# Patient Record
Sex: Male | Born: 1960 | Hispanic: Yes | Marital: Married | Smoking: Current some day smoker
Health system: Southern US, Community
[De-identification: ages and names within clinical notes are randomized; demographics above are authoritative.]

## PROBLEM LIST (undated history)

## (undated) DIAGNOSIS — C801 Malignant (primary) neoplasm, unspecified: Secondary | ICD-10-CM

## (undated) DIAGNOSIS — M1990 Unspecified osteoarthritis, unspecified site: Secondary | ICD-10-CM

## (undated) DIAGNOSIS — R0981 Nasal congestion: Secondary | ICD-10-CM

## (undated) DIAGNOSIS — R51 Headache: Secondary | ICD-10-CM

## (undated) DIAGNOSIS — R519 Headache, unspecified: Secondary | ICD-10-CM

## (undated) DIAGNOSIS — J189 Pneumonia, unspecified organism: Secondary | ICD-10-CM

## (undated) DIAGNOSIS — E119 Type 2 diabetes mellitus without complications: Secondary | ICD-10-CM

## (undated) DIAGNOSIS — G4730 Sleep apnea, unspecified: Secondary | ICD-10-CM

## (undated) DIAGNOSIS — I10 Essential (primary) hypertension: Secondary | ICD-10-CM

## (undated) HISTORY — PX: TONSILLECTOMY: SUR1361

---

## 2013-01-24 DIAGNOSIS — C801 Malignant (primary) neoplasm, unspecified: Secondary | ICD-10-CM

## 2013-01-24 HISTORY — PX: PROSTATE SURGERY: SHX751

## 2013-01-24 HISTORY — DX: Malignant (primary) neoplasm, unspecified: C80.1

## 2014-08-20 DIAGNOSIS — C61 Malignant neoplasm of prostate: Secondary | ICD-10-CM | POA: Insufficient documentation

## 2015-11-24 DIAGNOSIS — J189 Pneumonia, unspecified organism: Secondary | ICD-10-CM

## 2015-11-24 HISTORY — DX: Pneumonia, unspecified organism: J18.9

## 2016-01-26 ENCOUNTER — Ambulatory Visit (INDEPENDENT_AMBULATORY_CARE_PROVIDER_SITE_OTHER): Payer: Worker's Compensation | Admitting: Family Medicine

## 2016-01-26 VITALS — BP 136/88 | HR 88 | Temp 98.4°F | Resp 18 | Ht 68.0 in | Wt 333.8 lb

## 2016-01-26 DIAGNOSIS — M70031 Crepitant synovitis (acute) (chronic), right wrist: Secondary | ICD-10-CM

## 2016-01-26 DIAGNOSIS — M779 Enthesopathy, unspecified: Secondary | ICD-10-CM | POA: Diagnosis not present

## 2016-01-26 NOTE — Patient Instructions (Signed)
De Quervain Disease De Quervain disease is inflammation of the tendon on the thumb side of the wrist. Tendons are cords of tissue that connect bones to muscles. The tendons in your hand pass through a tunnel, or sheath. A slippery layer of tissue (synovium) lets the tendons move smoothly in the sheath. With de Quervain disease, the sheath swells or thickens, causing friction and pain. The condition is also called de Quervain tendinosis and de Quervain syndrome. It occurs most often in women who are 30-50 years old. CAUSES  The exact cause of de Quervain disease is not known. It may result from:   Overusing your hands, especially with repetitive motions that involve twisting your hand or using a forceful grip.  Pregnancy.  Rheumatoid disease. RISK FACTORS You may have a greater risk for de Quervain disease if you:  Are a middle-aged woman.  Are pregnant.  Have rheumatoid arthritis.  Have diabetes.  Use your hands far more than normal, especially with a tight grip or excessive twisting. SIGNS AND SYMPTOMS Pain on the thumb side of your wrist is the main symptom of de Quervain disease. Other signs and symptoms include:  Pain that gets worse when you grasp something or turn your wrist.  Pain that extends up the forearm.  Cysts in the area of the pain.  Swelling of your wrist and hand.  A sensation of snapping in the wrist.  Trouble moving the thumb and wrist. DIAGNOSIS  Your health care provider may diagnose de Quervain disease based on your signs and symptoms. A physical exam will also be done. A simple test (Finkelstein test) that involves pulling your thumb and wrist to see if this causes pain can help determine whether you have the condition. Sometimes you may need to have an X-ray.  TREATMENT  Avoiding any activity that causes pain and swelling is the best treatment. Other options include:  Wearing a splint.  Taking medicine. Anti-inflammatory medicines and corticosteroid  injections may reduce inflammation and relieve pain.  Having surgery if other treatments do not work. HOME CARE INSTRUCTIONS   Using ice can be helpful after doing activities that involve the sore wrist. To apply ice to the injured area:  Put ice in a plastic bag.  Place a towel between your skin and the bag.  Leave the ice on for 20 minutes, 2-3 times a day.  Take medicines only as directed by your health care provider.  Wear your splint as directed. This will allow your hand to rest and heal. SEEK MEDICAL CARE IF:   Your pain medicine does not help.   Your pain gets worse.  You develop new symptoms. MAKE SURE YOU:   Understand these instructions.  Will watch your condition.  Will get help right away if you are not doing well or get worse.   This information is not intended to replace advice given to you by your health care provider. Make sure you discuss any questions you have with your health care provider.   Document Released: 09/04/2001 Document Revised: 12/31/2014 Document Reviewed: 04/14/2014 Elsevier Interactive Patient Education 2016 Elsevier Inc.  

## 2016-01-26 NOTE — Progress Notes (Signed)
   Subjective:    Patient ID: Jerry Griffith, male    DOB: 21-Jul-1961, 55 y.o.   MRN: [id number] By signing my name below, I, Zola Button, attest that this documentation has been prepared under the direction and in the presence of [name], MD.  Electronically Signed: Zola Button, Medical Scribe. 01/26/2016. 11:35 AM.  HPI HPI Comments: Taivion Alvelo is a 55 y.o. male who presents to the [facility] for a worker's comp injury. 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 He reports having sharp pain to his right wrist that started the following day. He also reports having numbness to his thumb and index finger. Patient did see a doctor 3 days ago and was given a wrist brace and anti-inflammatory medications.    Review of Systems  Musculoskeletal: Positive for arthralgias.  Neurological: Positive for numbness.       Objective:   Physical Exam CONSTITUTIONAL: Well developed/well nourished HEAD: Normocephalic/atraumatic EYES: EOM/PERRL ENMT: Mucous membranes moist NECK: supple no meningeal signs SPINE: entire spine nontender CV: S1/S2 noted, no murmurs/rubs/gallops noted LUNGS: Lungs are clear to auscultation bilaterally, no apparent distress ABDOMEN: soft, nontender, no rebound or guarding GU: no cva tenderness NEURO: Pt is awake/alert, moves all extremitiesx4 EXTREMITIES: pulses normal, full ROM. Tender over the distal lateral radius at the joint line of the wrist. SKIN: warm, color normal PSYCH: no abnormalities of mood noted  Right extensor pollicis longus tendon area over the distal radius was injected with 1 cc Marcaine and 0.5 cc Kenalog.      Assessment & Plan:  Patient will come back Monday for a re-check.  This chart was scribed in my presence and reviewed by me personally.    ICD-9-CM ICD-10-CM   1. Tendonitis 726.90 M77.9   2. Crepitant synovitis of wrist, right  727.00 M70.031      Signed, [name], MD

## 2016-01-30 ENCOUNTER — Ambulatory Visit (INDEPENDENT_AMBULATORY_CARE_PROVIDER_SITE_OTHER): Payer: Worker's Compensation | Admitting: Family Medicine

## 2016-01-30 VITALS — BP 152/80 | HR 96 | Temp 98.8°F | Resp 16 | Ht 68.0 in | Wt 330.0 lb

## 2016-01-30 DIAGNOSIS — Y990 Civilian activity done for income or pay: Secondary | ICD-10-CM | POA: Diagnosis not present

## 2016-01-30 DIAGNOSIS — M25531 Pain in right wrist: Secondary | ICD-10-CM | POA: Diagnosis not present

## 2016-01-30 DIAGNOSIS — M65831 Other synovitis and tenosynovitis, right forearm: Secondary | ICD-10-CM

## 2016-01-30 DIAGNOSIS — M65841 Other synovitis and tenosynovitis, right hand: Secondary | ICD-10-CM

## 2016-01-30 MED ORDER — DICLOFENAC SODIUM 75 MG PO TBEC: 75.0000 mg | DELAYED_RELEASE_TABLET | Freq: Two times a day (BID) | ORAL | Status: DC

## 2016-01-30 NOTE — Patient Instructions (Addendum)
Wear the wrist splint as much of the time as possible  Limit your work this week per the work note.  Take diclofenac 75 mg one twice daily for inflammation in the wrist  Apply ice for 15 min 4 times daily  Return Friday or Saturday for one more recheck. If not improving we probably should see on to orthopedics

## 2016-01-30 NOTE — Progress Notes (Signed)
Patient ID: Jerry Griffith, male    DOB: 1961-12-19  Age: 55 y.o. MRN: [id number]  Chief Complaint  Patient presents with  . Follow-up    workers comp, rt. wrist, x 1 week     Subjective:   Patient had an acute flare of pain in his right wrist when moving a printer away from the wall. That was about 01/22/2016. Monday weeks ago he went to his primary care who gave him naproxen. He came in here on Thursday and saw Dr. Carlean Jews who gave an injection of Kenalog for tenosynovitis.  He has persisted with a numbness in his index and thumb finger since the initial pains and started. He periodically has had a cutting like pain down to his thumb. He has been hurting and tender in his wrists and sometimes it goes up a little further up the arm. He never had this before.  He has been wearing a short wrist splint which he has.  Current allergies, medications, problem list, past/family and social histories reviewed.  Objective:  BP 152/80 mmHg  Pulse 96  Temp(Src) 98.8 F (37.1 C) (Oral)  Resp 16  Ht 5\' 8"  (1.727 m)  Wt 330 lb (149.687 kg)  BMI 50.19 kg/m2  SpO2 97%  Subjective abnormal sensation in his thumb and index finger. The area of maximum pain is at the base of the thumb along the wrist. Not exquisitely painful and tender with movement.  Assessment & Plan:   Assessment: No diagnosis found.    Plan: Probably a partially resolved tenosynovitis, which is not extremely tender at this time. I think it needs more rest. The Kenalog still should be working on this. We'll give her a little additional insight treatment. He has been taking naproxen 500 mg daily. We'll change to diclofenac 75 mg twice daily. Spica wrist splint  No orders of the defined types were placed in this encounter.    No orders of the defined types were placed in this encounter.         There are no Patient Instructions on file for this visit.   No Follow-up on file.   HOPPER,DAVID, MD 01/30/2016

## 2016-02-03 ENCOUNTER — Ambulatory Visit (INDEPENDENT_AMBULATORY_CARE_PROVIDER_SITE_OTHER): Payer: Worker's Compensation | Admitting: Family Medicine

## 2016-02-03 VITALS — BP 150/92 | HR 101 | Temp 98.7°F | Resp 16 | Ht 68.0 in | Wt 331.0 lb

## 2016-02-03 DIAGNOSIS — M65841 Other synovitis and tenosynovitis, right hand: Secondary | ICD-10-CM

## 2016-02-03 DIAGNOSIS — M65831 Other synovitis and tenosynovitis, right forearm: Secondary | ICD-10-CM

## 2016-02-03 NOTE — Progress Notes (Addendum)
@UMFCLOGO @  By signing my name below, I, Jerry Griffith, attest that this documentation has been prepared under the direction and in the presence of [name], MD.  Electronically Signed: [name], ED Scribe. 02/03/2016. 1:43 PM.  Patient ID: Jerry Griffith MRN: [id number], DOB: April 08, 1961, 55 y.o. Date of Encounter: 02/03/2016, 1:43 PM  Primary Physician: No primary care provider on file.  Chief Complaint:  Chief Complaint  Patient presents with  . Follow-up    right wrist injury     HPI: 55 y.o. year old male with history below presents with right wrist pain. Pt reports occasional shocking sensation in right wrist with positional changes in wrist and right thumb. This morning while combing his hand and brushing his teeth he felt this sensation. States pain has worsened since last visit. He reports occasional numbness in right 1st and 2nd finger. He has been wearing a wrist splint without relief to pain.   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  History reviewed. No pertinent past medical history.   Home Meds: Prior to Admission medications   Medication Sig Start Date End Date Taking? Authorizing Provider  diclofenac (VOLTAREN) 75 MG EC tablet Take 1 tablet (75 mg total) by mouth 2 (two) times daily. 01/30/16  Yes Posey Boyer, MD  lisinopril-hydrochlorothiazide (PRINZIDE,ZESTORETIC) 20-25 MG tablet Take 1 tablet by mouth daily.   Yes Historical Provider, MD  metFORMIN (GLUCOPHAGE) 500 MG tablet Take 500 mg by mouth 2 (two) times daily with a meal.   Yes Historical Provider, MD    Allergies: No Known Allergies  Social History   Social History  . Marital Status: Unknown    Spouse Name: N/A  . Number of Children: N/A  . Years of Education: N/A   Occupational History  . Not on file.   Social History Main Topics  . Smoking status: Never Smoker   . Smokeless tobacco: Never Used  . Alcohol Use: No  . Drug Use: No  . Sexual Activity: Not on file    Other Topics Concern  . Not on file   Social History Narrative     Review of Systems: Constitutional: negative for chills, fever, night sweats, weight changes, or fatigue  HEENT: negative for vision changes, hearing loss, congestion, rhinorrhea, ST, epistaxis, or sinus pressure Cardiovascular: negative for chest pain or palpitations Respiratory: negative for hemoptysis, wheezing, shortness of breath, or cough Abdominal: negative for abdominal pain, nausea, vomiting, diarrhea, or constipation Dermatological: negative for rash Neurologic: negative for headache, dizziness, or syncope All other systems reviewed and are otherwise negative with the exception to those above and in the HPI.   Physical Exam: Blood pressure 150/92, pulse 101, temperature 98.7 F (37.1 C), temperature source Oral, resp. rate 16, height 5\' 8"  (1.727 m), weight 331 lb (150.141 kg), SpO2 96 %., Body mass index is 50.34 kg/(m^2). General: Well developed, well nourished, in no acute distress. Head: Normocephalic, atraumatic,  Lungs: Clear bilaterally to auscultation without wheezes, rales, or rhonchi. Breathing is unlabored. Heart: RRR with S1 S2. No murmurs, rubs, or gallops appreciated. Msk:  Strength and tone normal for age. Non tender in right wrist. Good ROM. Extremities/Skin: Warm and dry. No clubbing or cyanosis. No edema. No rashes or suspicious lesions. Neuro: Alert and oriented X 3. Moves all extremities spontaneously. Gait is normal. CNII-XII grossly in tact. Psych:  Responds to questions appropriately with a normal affect.    ASSESSMENT AND PLAN:  55 y.o. year old male with  This  chart was scribed in my presence and reviewed by me personally.    ICD-9-CM ICD-10-CM   1. Extensor tenosynovitis of right wrist 727.05 M65.841 Ambulatory referral to Hand Surgery     Signed, [name], MD 02/03/2016 1:43 PM

## 2016-02-03 NOTE — Patient Instructions (Signed)
We are going to make an appointment with a hand specialist for you next week. Until a time she should stay on for

## 2016-02-03 NOTE — Progress Notes (Deleted)
[facility] [address] [place] [address] [phone]  Date:  02/03/2016   Name:  Jerry Griffith   DOB:  07-17-1961   MRN:  [id number]  PCP:  No primary care provider on file.    Chief Complaint: Follow-up   History of Present Illness:  This is a 55 y.o. male with PMH *** who is presenting  Review of Systems:  Review of Systems  There are no active problems to display for this patient.   Prior to Admission medications   Medication Sig Start Date End Date Taking? Authorizing Provider  diclofenac (VOLTAREN) 75 MG EC tablet Take 1 tablet (75 mg total) by mouth 2 (two) times daily. 01/30/16  Yes Posey Boyer, MD  lisinopril-hydrochlorothiazide (PRINZIDE,ZESTORETIC) 20-25 MG tablet Take 1 tablet by mouth daily.   Yes Historical Provider, MD  metFORMIN (GLUCOPHAGE) 500 MG tablet Take 500 mg by mouth 2 (two) times daily with a meal.   Yes Historical Provider, MD    No Known Allergies  History reviewed. No pertinent past surgical history.  Social History  Substance Use Topics  . Smoking status: Never Smoker   . Smokeless tobacco: Never Used  . Alcohol Use: No    History reviewed. No pertinent family history.  Medication list has been reviewed and updated.  Physical Examination:  Physical Exam  BP 168/102 mmHg  Pulse 101  Temp(Src) 98.7 F (37.1 C) (Oral)  Resp 16  Ht 5\' 8"  (1.727 m)  Wt 331 lb (150.141 kg)  BMI 50.34 kg/m2  SpO2 96%  Assessment and Plan:

## 2016-03-13 ENCOUNTER — Other Ambulatory Visit: Payer: Self-pay | Admitting: Orthopedic Surgery

## 2016-03-13 DIAGNOSIS — M5412 Radiculopathy, cervical region: Secondary | ICD-10-CM

## 2016-03-21 ENCOUNTER — Ambulatory Visit
Admission: RE | Admit: 2016-03-21 | Discharge: 2016-03-21 | Disposition: A | Discharge status: Home / Self Care | Payer: Managed Care, Other (non HMO) | Source: Ambulatory Visit | Attending: Orthopedic Surgery | Admitting: Orthopedic Surgery

## 2016-03-21 DIAGNOSIS — M5412 Radiculopathy, cervical region: Secondary | ICD-10-CM

## 2016-03-23 ENCOUNTER — Other Ambulatory Visit: Payer: Self-pay | Admitting: Orthopedic Surgery

## 2016-03-28 ENCOUNTER — Encounter (HOSPITAL_COMMUNITY): Payer: Self-pay

## 2016-03-28 ENCOUNTER — Other Ambulatory Visit (HOSPITAL_COMMUNITY): Payer: Self-pay | Admitting: *Deleted

## 2016-03-28 ENCOUNTER — Encounter (HOSPITAL_COMMUNITY)
Admission: RE | Admit: 2016-03-28 | Discharge: 2016-03-28 | Disposition: A | Discharge status: Home / Self Care | Payer: Worker's Compensation | Source: Ambulatory Visit | Attending: Orthopedic Surgery | Admitting: Orthopedic Surgery

## 2016-03-28 DIAGNOSIS — R000 Tachycardia, unspecified: Secondary | ICD-10-CM | POA: Insufficient documentation

## 2016-03-28 DIAGNOSIS — I10 Essential (primary) hypertension: Secondary | ICD-10-CM | POA: Insufficient documentation

## 2016-03-28 DIAGNOSIS — Z01818 Encounter for other preprocedural examination: Secondary | ICD-10-CM | POA: Insufficient documentation

## 2016-03-28 DIAGNOSIS — Z01812 Encounter for preprocedural laboratory examination: Secondary | ICD-10-CM | POA: Insufficient documentation

## 2016-03-28 DIAGNOSIS — G5601 Carpal tunnel syndrome, right upper limb: Secondary | ICD-10-CM | POA: Insufficient documentation

## 2016-03-28 HISTORY — DX: Essential (primary) hypertension: I10

## 2016-03-28 HISTORY — DX: Sleep apnea, unspecified: G47.30

## 2016-03-28 HISTORY — DX: Malignant (primary) neoplasm, unspecified: C80.1

## 2016-03-28 HISTORY — DX: Headache, unspecified: R51.9

## 2016-03-28 HISTORY — DX: Unspecified osteoarthritis, unspecified site: M19.90

## 2016-03-28 HISTORY — DX: Nasal congestion: R09.81

## 2016-03-28 HISTORY — DX: Type 2 diabetes mellitus without complications: E11.9

## 2016-03-28 HISTORY — DX: Headache: R51

## 2016-03-28 HISTORY — DX: Pneumonia, unspecified organism: J18.9

## 2016-03-28 LAB — BASIC METABOLIC PANEL
ANION GAP: 10 (ref 5–15)
BUN: 15 mg/dL (ref 6–20)
CALCIUM: 9.3 mg/dL (ref 8.9–10.3)
CO2: 27 mmol/L (ref 22–32)
Chloride: 106 mmol/L (ref 101–111)
Creatinine, Ser: 0.72 mg/dL (ref 0.61–1.24)
GFR calc non Af Amer: >60 mL/min (ref >60)
Glucose, Bld: 164 mg/dL — ABNORMAL HIGH (ref 65–99)
POTASSIUM: 4.1 mmol/L (ref 3.5–5.1)
SODIUM: 143 mmol/L (ref 135–145)

## 2016-03-28 LAB — CBC
HEMATOCRIT: 43.3 % (ref 39.0–52.0)
HEMOGLOBIN: 13.6 g/dL (ref 13.0–17.0)
MCH: 26.9 pg (ref 26.0–34.0)
MCHC: 31.4 g/dL (ref 30.0–36.0)
MCV: 85.7 fL (ref 78.0–100.0)
Platelets: 284 10*3/uL (ref 150–400)
RBC: 5.05 MIL/uL (ref 4.22–5.81)
RDW: 16.1 % — ABNORMAL HIGH (ref 11.5–15.5)
WBC: 11.1 10*3/uL — ABNORMAL HIGH (ref 4.0–10.5)

## 2016-03-28 LAB — GLUCOSE, CAPILLARY: GLUCOSE-CAPILLARY: 154 mg/dL — AB (ref 65–99)

## 2016-03-28 NOTE — Pre-Procedure Instructions (Addendum)
Jerry Griffith  03/28/2016      [facility] # [place], [place] [address] Phone: [phone] Fax: [phone]    Your procedure is scheduled on Thursday, April 05, 2016    Report to [facility] Entrance "A" Admitting Office at 6:30 AM.   Call this number if you have problems the morning of surgery: [phone]   Any questions prior to day of surgery, please call [phone] between 8 & 4 PM.   Remember:  Do not eat food or drink liquids after midnight Wednesday, 04/04/16.  STOP all herbel meds nsaids (aleve,naproxen,advil,ibuprofen) 5 days prior to surgery starting 03/31/16 including diclofenac, aspirin, vitamins(vit c, fish oil, multi)   No metformin am of surgery                      How to Manage Your Diabetes Before Surgery   Why is it important to control my blood sugar before and after surgery?   Improving blood sugar levels before and after surgery helps healing and can limit problems.  A way of improving blood sugar control is eating a healthy diet by:  - Eating less sugar and carbohydrates  - Increasing activity/exercise  - Talk with your doctor about reaching your blood sugar goals  High blood sugars (greater than 180 mg/dL) can raise your risk of infections and slow down your recovery so you will need to focus on controlling your diabetes during the weeks before surgery.  Make sure that the doctor who takes care of your diabetes knows about your planned surgery including the date and location.  How do I manage my blood sugars before surgery?   Check your blood sugar at least 4 times a day, 2 days before surgery to make sure that they are not too high or low.  Check your blood sugar the morning of your surgery when you wake up and every 2 hours until you get to the Short-Stay unit.  Treat a low blood sugar (less than 70 mg/dL) with 1/2 cup of clear juice (cranberry or apple), 4  glucose tablets, OR glucose gel.  Recheck blood sugar in 15 minutes after treatment (to make sure it is greater than 70 mg/dL).  If blood sugar is not greater than 70 mg/dL on re-check, call [phone] for further instructions.   Report your blood sugar to the Short-Stay nurse when you get to Short-Stay.  References:  [facility], 2007 "How to Manage your Diabetes Before and After Surgery".  What do I do about my diabetes medications?   Do not take oral diabetes medicines (pills) the morning of surgery.(metformin)   Do not wear jewelry.  Do not wear lotions, powders, or cologne.  You may wear deodorant.  Men may shave face and neck.  Do not bring valuables to the hospital.  [facility] is not responsible for any belongings or valuables.  Contacts, dentures or bridgework may not be worn into surgery.  Leave your suitcase in the car.  After surgery it may be brought to your room.  For patients admitted to the hospital, discharge time will be determined by your treatment team.  Patients discharged the day of surgery will not be allowed to drive home.   Special instructions:  [facility] - Preparing for Surgery  Before surgery, you can play an important role.  Because skin is not sterile, your skin needs  to be as free of germs as possible.  You can reduce the number of germs on you skin by washing with CHG (chlorahexidine gluconate) soap before surgery.  CHG is an antiseptic cleaner which kills germs and bonds with the skin to continue killing germs even after washing.  Please DO NOT use if you have an allergy to CHG or antibacterial soaps.  If your skin becomes reddened/irritated stop using the CHG and inform your nurse when you arrive at Short Stay.  Do not shave (including legs and underarms) for at least 48 hours prior to the first CHG shower.  You may shave your face.  Please follow these instructions carefully:   1.  Shower with CHG Soap the night  before surgery and the                                morning of Surgery.  2.  If you choose to wash your hair, wash your hair first as usual with your       normal shampoo.  3.  After you shampoo, rinse your hair and body thoroughly to remove the                      Shampoo.  4.  Use CHG as you would any other liquid soap.  You can apply chg directly       to the skin and wash gently with scrungie or a clean washcloth.  5.  Apply the CHG Soap to your body ONLY FROM THE NECK DOWN.        Do not use on open wounds or open sores.  Avoid contact with your eyes, ears, mouth and genitals (private parts).  Wash genitals (private parts) with your normal soap.  6.  Wash thoroughly, paying special attention to the area where your surgery        will be performed.  7.  Thoroughly rinse your body with warm water from the neck down.  8.  DO NOT shower/wash with your normal soap after using and rinsing off       the CHG Soap.  9.  Pat yourself dry with a clean towel.            10.  Wear clean pajamas.            11.  Place clean sheets on your bed the night of your first shower and do not        sleep with pets.  Day of Surgery  Do not apply any lotions the morning of surgery.  Please wear clean clothes to the hospital.   Please read over the following fact sheets that you were given. Pain Booklet, Coughing and Deep Breathing and Surgical Site Infection Prevention

## 2016-04-04 MED ORDER — DEXTROSE 5 % IV SOLN
3.0000 g | INTRAVENOUS | Status: AC
  Administered 2016-04-05: 3 g via INTRAVENOUS
  Filled 2016-04-04: qty 3000

## 2016-04-05 ENCOUNTER — Ambulatory Visit (HOSPITAL_COMMUNITY)
Admission: RE | Admit: 2016-04-05 | Discharge: 2016-04-05 | Disposition: A | Discharge status: Home / Self Care | Payer: Worker's Compensation | Source: Ambulatory Visit | Attending: Orthopedic Surgery | Admitting: Orthopedic Surgery

## 2016-04-05 ENCOUNTER — Ambulatory Visit (HOSPITAL_COMMUNITY): Payer: Worker's Compensation | Admitting: Anesthesiology

## 2016-04-05 ENCOUNTER — Encounter (HOSPITAL_COMMUNITY)
Admission: RE | Disposition: A | Discharge status: Home / Self Care | Payer: Self-pay | Source: Ambulatory Visit | Attending: Orthopedic Surgery

## 2016-04-05 ENCOUNTER — Encounter (HOSPITAL_COMMUNITY): Payer: Self-pay | Admitting: *Deleted

## 2016-04-05 DIAGNOSIS — E119 Type 2 diabetes mellitus without complications: Secondary | ICD-10-CM | POA: Diagnosis not present

## 2016-04-05 DIAGNOSIS — Z7984 Long term (current) use of oral hypoglycemic drugs: Secondary | ICD-10-CM | POA: Diagnosis not present

## 2016-04-05 DIAGNOSIS — F17290 Nicotine dependence, other tobacco product, uncomplicated: Secondary | ICD-10-CM | POA: Diagnosis not present

## 2016-04-05 DIAGNOSIS — G5601 Carpal tunnel syndrome, right upper limb: Secondary | ICD-10-CM | POA: Diagnosis present

## 2016-04-05 DIAGNOSIS — Z8546 Personal history of malignant neoplasm of prostate: Secondary | ICD-10-CM | POA: Insufficient documentation

## 2016-04-05 DIAGNOSIS — Z79899 Other long term (current) drug therapy: Secondary | ICD-10-CM | POA: Diagnosis not present

## 2016-04-05 DIAGNOSIS — G4730 Sleep apnea, unspecified: Secondary | ICD-10-CM | POA: Insufficient documentation

## 2016-04-05 DIAGNOSIS — I10 Essential (primary) hypertension: Secondary | ICD-10-CM | POA: Diagnosis not present

## 2016-04-05 DIAGNOSIS — Z7951 Long term (current) use of inhaled steroids: Secondary | ICD-10-CM | POA: Diagnosis not present

## 2016-04-05 HISTORY — PX: CARPAL TUNNEL RELEASE: SHX101

## 2016-04-05 LAB — GLUCOSE, CAPILLARY
GLUCOSE-CAPILLARY: 114 mg/dL — AB (ref 65–99)
GLUCOSE-CAPILLARY: 135 mg/dL — AB (ref 65–99)
Glucose-Capillary: 138 mg/dL — ABNORMAL HIGH (ref 65–99)

## 2016-04-05 SURGERY — CARPAL TUNNEL RELEASE
Anesthesia: Monitor Anesthesia Care | Site: Wrist | Laterality: Right

## 2016-04-05 MED ORDER — CHLORHEXIDINE GLUCONATE 4 % EX LIQD: 60.0000 mL | Freq: Once | CUTANEOUS | Status: DC

## 2016-04-05 MED ORDER — 0.9 % SODIUM CHLORIDE (POUR BTL) OPTIME
TOPICAL | Status: DC | PRN
  Administered 2016-04-05: 1000 mL

## 2016-04-05 MED ORDER — BUPIVACAINE HCL (PF) 0.25 % IJ SOLN
INTRAMUSCULAR | Status: DC | PRN
  Administered 2016-04-05: 10 mL

## 2016-04-05 MED ORDER — FENTANYL CITRATE (PF) 250 MCG/5ML IJ SOLN
INTRAMUSCULAR | Status: AC
  Filled 2016-04-05: qty 5

## 2016-04-05 MED ORDER — LACTATED RINGERS IV SOLN
INTRAVENOUS | Status: DC
  Administered 2016-04-05: 07:00:00 via INTRAVENOUS

## 2016-04-05 MED ORDER — MIDAZOLAM HCL 2 MG/2ML IJ SOLN
INTRAMUSCULAR | Status: AC
  Filled 2016-04-05: qty 2

## 2016-04-05 MED ORDER — FENTANYL CITRATE (PF) 100 MCG/2ML IJ SOLN: 25.0000 ug | INTRAMUSCULAR | Status: DC | PRN

## 2016-04-05 MED ORDER — OXYCODONE HCL 5 MG PO TABS: 5.0000 mg | ORAL_TABLET | Freq: Once | ORAL | Status: DC | PRN

## 2016-04-05 MED ORDER — ONDANSETRON HCL 4 MG/2ML IJ SOLN: 4.0000 mg | Freq: Once | INTRAMUSCULAR | Status: DC | PRN

## 2016-04-05 MED ORDER — MIDAZOLAM HCL 5 MG/5ML IJ SOLN
INTRAMUSCULAR | Status: DC | PRN
  Administered 2016-04-05: 1 mg via INTRAVENOUS

## 2016-04-05 MED ORDER — PROPOFOL 1000 MG/100ML IV EMUL
INTRAVENOUS | Status: AC
  Filled 2016-04-05: qty 100

## 2016-04-05 MED ORDER — OXYCODONE HCL 5 MG/5ML PO SOLN: 5.0000 mg | Freq: Once | ORAL | Status: DC | PRN

## 2016-04-05 MED ORDER — PROPOFOL 10 MG/ML IV BOLUS
INTRAVENOUS | Status: AC
  Filled 2016-04-05: qty 40

## 2016-04-05 MED ORDER — FENTANYL CITRATE (PF) 100 MCG/2ML IJ SOLN
INTRAMUSCULAR | Status: DC | PRN
  Administered 2016-04-05: 50 ug via INTRAVENOUS

## 2016-04-05 MED ORDER — PROPOFOL 500 MG/50ML IV EMUL
INTRAVENOUS | Status: DC | PRN
  Administered 2016-04-05: 50 ug/kg/min via INTRAVENOUS

## 2016-04-05 MED ORDER — BUPIVACAINE HCL (PF) 0.25 % IJ SOLN
INTRAMUSCULAR | Status: AC
  Filled 2016-04-05: qty 30

## 2016-04-05 MED ORDER — HYDROCODONE-ACETAMINOPHEN 5-325 MG PO TABS: ORAL_TABLET | ORAL | Status: DC

## 2016-04-05 SURGICAL SUPPLY — 39 items
BANDAGE ACE 3X5.8 VEL STRL LF (GAUZE/BANDAGES/DRESSINGS) ×3 IMPLANT
BANDAGE ELASTIC 3 VELCRO ST LF (GAUZE/BANDAGES/DRESSINGS) IMPLANT
BANDAGE ELASTIC 4 VELCRO ST LF (GAUZE/BANDAGES/DRESSINGS) IMPLANT
BNDG ESMARK 4X9 LF (GAUZE/BANDAGES/DRESSINGS) ×3 IMPLANT
BNDG GAUZE ELAST 4 BULKY (GAUZE/BANDAGES/DRESSINGS) ×3 IMPLANT
CORDS BIPOLAR (ELECTRODE) ×3 IMPLANT
COVER SURGICAL LIGHT HANDLE (MISCELLANEOUS) ×3 IMPLANT
CUFF TOURNIQUET SINGLE 18IN (TOURNIQUET CUFF) IMPLANT
CUFF TOURNIQUET SINGLE 24IN (TOURNIQUET CUFF) IMPLANT
DRAPE SURG 17X23 STRL (DRAPES) ×3 IMPLANT
DRSG PAD ABDOMINAL 8X10 ST (GAUZE/BANDAGES/DRESSINGS) ×3 IMPLANT
DURAPREP 26ML APPLICATOR (WOUND CARE) ×3 IMPLANT
GAUZE SPONGE 4X4 12PLY STRL (GAUZE/BANDAGES/DRESSINGS) ×3 IMPLANT
GAUZE XEROFORM 1X8 LF (GAUZE/BANDAGES/DRESSINGS) ×3 IMPLANT
GLOVE BIO SURGEON STRL SZ7.5 (GLOVE) ×6 IMPLANT
GLOVE BIOGEL PI IND STRL 8 (GLOVE) ×1 IMPLANT
GLOVE BIOGEL PI INDICATOR 8 (GLOVE) ×2
GOWN STRL REUS W/ TWL LRG LVL3 (GOWN DISPOSABLE) ×2 IMPLANT
GOWN STRL REUS W/TWL LRG LVL3 (GOWN DISPOSABLE) ×4
KIT BASIN OR (CUSTOM PROCEDURE TRAY) ×3 IMPLANT
KIT ROOM TURNOVER OR (KITS) ×3 IMPLANT
NEEDLE HYPO 25GX1X1/2 BEV (NEEDLE) ×3 IMPLANT
NS IRRIG 1000ML POUR BTL (IV SOLUTION) ×3 IMPLANT
PACK ORTHO EXTREMITY (CUSTOM PROCEDURE TRAY) ×3 IMPLANT
PAD ARMBOARD 7.5X6 YLW CONV (MISCELLANEOUS) ×6 IMPLANT
PADDING CAST ABS 4INX4YD NS (CAST SUPPLIES)
PADDING CAST ABS COTTON 4X4 ST (CAST SUPPLIES) IMPLANT
SPONGE LAP 4X18 X RAY DECT (DISPOSABLE) IMPLANT
SUCTION FRAZIER HANDLE 10FR (MISCELLANEOUS) ×2
SUCTION TUBE FRAZIER 10FR DISP (MISCELLANEOUS) ×1 IMPLANT
SUT ETHILON 3 0 PS 1 (SUTURE) ×3 IMPLANT
SUT VIC AB 3-0 SH 18 (SUTURE) IMPLANT
SYR CONTROL 10ML LL (SYRINGE) ×3 IMPLANT
TOWEL OR 17X24 6PK STRL BLUE (TOWEL DISPOSABLE) ×3 IMPLANT
TOWEL OR 17X26 10 PK STRL BLUE (TOWEL DISPOSABLE) ×3 IMPLANT
TUBE CONNECTING 12'X1/4 (SUCTIONS) ×1
TUBE CONNECTING 12X1/4 (SUCTIONS) ×2 IMPLANT
UNDERPAD 30X30 INCONTINENT (UNDERPADS AND DIAPERS) ×3 IMPLANT
WATER STERILE IRR 1000ML POUR (IV SOLUTION) IMPLANT

## 2016-04-05 NOTE — Anesthesia Postprocedure Evaluation (Signed)
Anesthesia Post Note  Patient: Jerry Griffith  Procedure(s) Performed: Procedure(s) (LRB): RIGHT CARPAL TUNNEL RELEASE (Right)  Patient location during evaluation: PACU Anesthesia Type: MAC and Bier Block Level of consciousness: awake, awake and alert and oriented Pain management: pain level controlled Vital Signs Assessment: post-procedure vital signs reviewed and stable Respiratory status: spontaneous breathing, nonlabored ventilation and respiratory function stable Cardiovascular status: blood pressure returned to baseline Anesthetic complications: no    Last Vitals:  Filed Vitals:   04/05/16 0943 04/05/16 0958  BP: 139/85 134/87  Pulse: 85 86  Temp:  36.6 C  Resp: 14 13    Last Pain: There were no vitals filed for this visit.               Maddison Kilner COKER

## 2016-04-05 NOTE — Discharge Instructions (Signed)

## 2016-04-05 NOTE — Anesthesia Preprocedure Evaluation (Signed)
Anesthesia Evaluation  Patient identified by MRN, date of birth, ID band Patient awake    Reviewed: Allergy & Precautions, NPO status , Patient's Chart, lab work & pertinent test results  Airway Mallampati: III  TM Distance: >3 FB Neck ROM: Full    Dental  (+) Teeth Intact, Dental Advisory Given   Pulmonary Current Smoker,    breath sounds clear to auscultation       Cardiovascular hypertension,  Rhythm:Regular Rate:Normal     Neuro/Psych    GI/Hepatic   Endo/Other  diabetes  Renal/GU      Musculoskeletal   Abdominal (+) + obese,   Peds  Hematology   Anesthesia Other Findings   Reproductive/Obstetrics                             Anesthesia Physical Anesthesia Plan  ASA: III  Anesthesia Plan: MAC and Regional   Post-op Pain Management:    Induction:   Airway Management Planned: Natural Airway and Nasal Cannula  Additional Equipment:   Intra-op Plan:   Post-operative Plan: Extubation in OR  Informed Consent: I have reviewed the patients History and Physical, chart, labs and discussed the procedure including the risks, benefits and alternatives for the proposed anesthesia with the patient or authorized representative who has indicated his/her understanding and acceptance.   Dental advisory given  Plan Discussed with: CRNA and Anesthesiologist  Anesthesia Plan Comments: (R. CTS Type 2 DM glucose 138  Hypertension Obesity Sleep apnea on CPAP  Plan IV Regional )        Anesthesia Quick Evaluation

## 2016-04-05 NOTE — H&P (Signed)
  Jerry Griffith is an 55 y.o. male.   Chief Complaint: right carpal tunnel syndrome HPI: 55 yo male with carpal tunnel syndrome right hand.  This has been bothersome to him since an injury at work.  Positive nerve conduction studies.  He wishes to have a right carpal tunnel release for management of the symptoms.  Allergies: No Known Allergies  Past Medical History  Diagnosis Date  . Hypertension   . Sleep apnea     cpap  . Sinus congestion   . Pneumonia 12/16    ?  Marland Kitchen Diabetes mellitus without complication ([place])   . 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 Arthritis   . Cancer [facility]) 2/14    prostate    Past Surgical History  Procedure Laterality Date  . Prostate surgery  2/14  . Tonsillectomy      Family History: History reviewed. No pertinent family history.  Social History:   reports that he has been smoking Cigars.  He has never used smokeless tobacco. He reports that he does not drink alcohol or use illicit drugs.  Medications: Medications Prior to Admission  Medication Sig Dispense Refill  . fluticasone (FLONASE) 50 MCG/ACT nasal spray Place 1 spray into both nostrils daily.    Marland Kitchen lisinopril-hydrochlorothiazide (PRINZIDE,ZESTORETIC) 20-25 MG tablet Take 1 tablet by mouth daily.    . metFORMIN (GLUCOPHAGE) 500 MG tablet Take 500 mg by mouth daily with breakfast.     . Multiple Vitamin (MULTIVITAMIN) capsule Take 1 capsule by mouth daily.    . Omega-3 1000 MG CAPS Take 1 g by mouth daily.    . vitamin C (ASCORBIC ACID) 250 MG tablet Take 250 mg by mouth daily.      Results for orders placed or performed during the hospital encounter of 04/05/16 (from the past 48 hour(s))  Glucose, capillary     Status: Abnormal   Collection Time: 04/05/16  6:09 AM  Result Value Ref Range   Glucose-Capillary 138 (H) 65 - 99 mg/dL  Glucose, capillary     Status: Abnormal   Collection Time: 04/05/16  8:12 AM  Result Value Ref Range   Glucose-Capillary 114 (H) 65 - 99 mg/dL   Comment 1 Notify RN     No results found.   A comprehensive review of systems was negative except for: Neurological: positive for headaches  Blood pressure 168/88, pulse 89, temperature 98.3 F (36.8 C), temperature source Oral, resp. rate 20, height 5' 8.5" (1.74 m), weight 150.594 kg (332 lb), SpO2 97 %.  General appearance: alert, cooperative and appears stated age Head: Normocephalic, without obvious abnormality, atraumatic Neck: supple, symmetrical, trachea midline Resp: clear to auscultation bilaterally Cardio: regular rate and rhythm GI: non-tender Extremities: Intact sensation and capillary refill all digits.  +epl/fpl/io.  No wounds. Pulses: 2+ and symmetric Skin: Skin color, texture, turgor normal. No rashes or lesions Neurologic: Grossly normal Incision/Wound:none  Assessment/Plan Right carpal tunnel syndrome.  Non operative and operative treatment options were discussed with the patient and patient wishes to proceed with operative treatment. Risks, benefits, and alternatives of surgery were discussed and the patient agrees with the plan of care.   Mollye Guinta R 04/05/2016, 8:28 AM

## 2016-04-05 NOTE — Progress Notes (Signed)
Jerry Griffith to be D/C'd home per MD order. Discussed with the patient and all questions fully answered.  VSS, Surgical site clean, dry, intact with compression wrap in place.  IV catheter discontinued intact. Site without signs and symptoms of complications. Dressing and pressure applied.  An After Visit Summary was printed and given to the patient. Patient received prescription.  D/c education completed with patient/family including follow up instructions, medication list, d/c activities limitations if indicated, with other d/c instructions as indicated by MD - patient able to verbalize understanding, all questions fully answered.   Patient instructed to return to ED, call 911, or call MD for any changes in condition.   Patient to be escorted via [place], and D/C home via private auto.

## 2016-04-05 NOTE — Op Note (Signed)
[id number] 

## 2016-04-05 NOTE — Brief Op Note (Signed)
04/05/2016  9:20 AM  PATIENT:  Purnell Hijazi  55 y.o. male  PRE-OPERATIVE DIAGNOSIS:  RIGHT CARPAL TUNNEL SYNDROME  POST-OPERATIVE DIAGNOSIS:  RIGHT CARPAL TUNNEL SYNDROME  PROCEDURE:  Procedure(s): RIGHT CARPAL TUNNEL RELEASE (Right)  SURGEON:  Surgeon(s) and Role:    * Leanora Cover, MD - Primary  PHYSICIAN ASSISTANT:   ASSISTANTS: none   ANESTHESIA:   Bier block with sedation  EBL:     BLOOD ADMINISTERED:none  DRAINS: none   LOCAL MEDICATIONS USED:  MARCAINE     SPECIMEN:  No Specimen  DISPOSITION OF SPECIMEN:  N/A  COUNTS:  YES  TOURNIQUET:   Total Tourniquet Time Documented: Upper Arm (Right) - 33 minutes Total: Upper Arm (Right) - 33 minutes   DICTATION: .Other Dictation: Dictation Number [id number]  PLAN OF CARE: Discharge to home after PACU  PATIENT DISPOSITION:  PACU - hemodynamically stable.

## 2016-04-05 NOTE — Transfer of Care (Signed)
Immediate Anesthesia Transfer of Care Note  Patient: Jerry Griffith  Procedure(s) Performed: Procedure(s): RIGHT CARPAL TUNNEL RELEASE (Right)  Patient Location: PACU  Anesthesia Type:Regional  Level of Consciousness: awake, alert , oriented and patient cooperative  Airway & Oxygen Therapy: Patient Spontanous Breathing and room air  Post-op Assessment: Report given to RN, Post -op Vital signs reviewed and stable and Patient moving all extremities  Post vital signs: Reviewed and stable  Last Vitals:  Filed Vitals:   04/05/16 0629 04/05/16 0927  BP:  140/77  Pulse: 89   Temp:    Resp:      Complications: No apparent anesthesia complications

## 2016-04-06 NOTE — Op Note (Signed)
NAMEKERVEN, STEINHARDT NO.:  [id number]  MEDICAL RECORD NO.:  [id number]  LOCATION:  [facility]                         FACILITY:  [facility]  PHYSICIAN:  Leanora Cover, MD        DATE OF BIRTH:  06-24-1961  DATE OF PROCEDURE:  04/05/2016 DATE OF DISCHARGE:  04/05/2016                              OPERATIVE REPORT   PREOPERATIVE DIAGNOSIS:  Right carpal tunnel syndrome.  POSTOPERATIVE DIAGNOSIS:  Right carpal tunnel syndrome.  PROCEDURE:  Right carpal tunnel release.  SURGEON:  Leanora Cover, MD.  ASSISTANT:  None.  ANESTHESIA:  Bier block with sedation.  IV FLUIDS:  Per anesthesia flow sheet.  ESTIMATED BLOOD:  Minimal.  COMPLICATIONS:  None.  SPECIMENS:  None.  TIME OF TOURNIQUET:  33 minutes.  DISPOSITION:  Stable to PACU.  INDICATIONS:  Mr. Dircks is a 55 year old male, who states he started having pins and needles and pain in the right hand after moving a carpet at work.  He has positive nerve conduction studies.  He wished to have a carpal tunnel release for management of symptoms.  Risks, benefits, and alternatives of surgery were discussed including the risk of blood loss; infection; damage to nerves, vessels, tendons, ligaments, bone; failure of surgery; need for additional surgery; complications with wound healing; continued pain; recurrence of carpal tunnel syndrome; and damage to motor branch.  He voiced understanding of these risks and elected to proceed.  OPERATIVE COURSE:  After being identified preoperatively by myself, the patient and I agreed upon procedure and site of procedure.  The surgical site was marked.  The risks, benefits, and alternatives of surgery were reviewed and he wished to proceed.  Surgical consent had been signed. He was given IV Ancef as preoperative antibiotic prophylaxis.  He was transferred to the operating room and placed on the operating room table in supine position with the right upper extremity on arm board.   Bier block anesthesia was induced by Anesthesiology.  Right upper extremity was prepped and draped in normal sterile orthopedic fashion.  Surgical pause was performed between surgeons, anesthesia, and operating room staff; and all were in agreement as to the patient, procedure, and site of procedure.  Tourniquet at the proximal aspect of the extremity had been inflated for the Bier block.  Incision made over the transverse carpal ligament, carried into subcutaneous tissues by spreading technique.  Bipolar electrocautery was used to obtain hemostasis.  The palmar fascia was sharply incised.  The transverse carpal ligament was identified and incised sharply with knife.  It was incised distally first.  Care was taken to ensure complete decompression distally.  It was then incised proximally.  Scissors were used to split the distal aspect of the volar antebrachial fascia.  The finger was placed into the wound to ensure complete decompression which was the case.  The nerve was inspected.  It was flattened and hyperemic.  The motor branch was identified and was intact.  There was thickened synovium surrounding the tendons.  The wound was copiously irrigated with sterile saline.  It was closed with 3-0 nylon in horizontal mattress fashion.  It was injected with 10 mL of 0.25% plain Marcaine to aid in postoperative analgesia.  It was then dressed with sterile Xeroform, 4x4s, and ABD and wrapped with Kerlix and Ace bandage.  Tourniquet was deflated at 33 minutes. Fingertips were pink with brisk capillary refill after deflation of tourniquet.  The operative drapes were broken down.  The patient was awoken from anesthesia safely.  He was transferred back to stretcher and taken to PACU in stable condition.  I will see him back in the office in 1 week for postoperative followup.  I will give him Norco 5/325, 1-2 p.o. q.6 hours p.r.n. pain, dispensed #30.     Leanora Cover, MD     KK/MEDQ  D:   04/05/2016  T:  04/06/2016  Job:  [id number]

## 2016-04-10 ENCOUNTER — Encounter (HOSPITAL_COMMUNITY): Payer: Self-pay | Admitting: Orthopedic Surgery

## 2016-12-10 DIAGNOSIS — E559 Vitamin D deficiency, unspecified: Secondary | ICD-10-CM | POA: Insufficient documentation

## 2018-02-21 IMAGING — MR MR CERVICAL SPINE W/O CM
4 of 5 series · 28 of 48 positions shown · non-contrast
Comparison: None.

CLINICAL DATA: Neck pain with numbness in the right thumb and index
finger.

EXAM:
MRI CERVICAL SPINE WITHOUT CONTRAST
TECHNIQUE: Multiplanar, multisequence MR imaging of the cervical spine was
performed. No intravenous contrast was administered.

[Series 3: T2 · sagittal · 3.0mm · 0.66mm/px · 6 of 13 slices shown (1 of 2)]
[im 1/13]
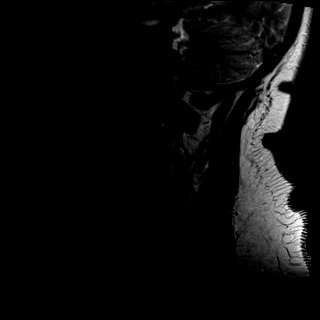
[im 3/13]
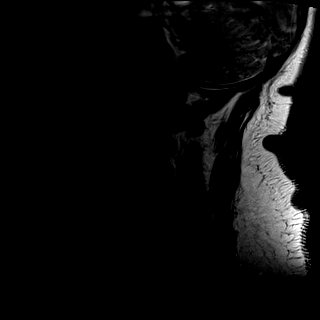
[im 5/13]
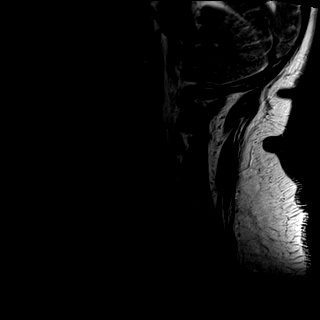
[im 8/13]
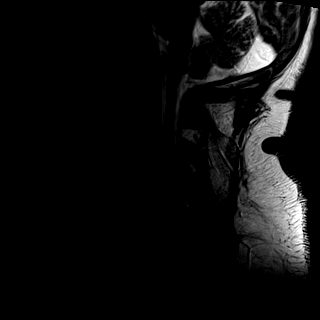
[im 10/13]
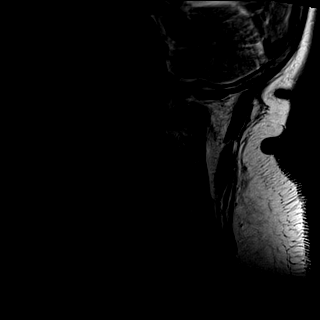
[im 13/13]
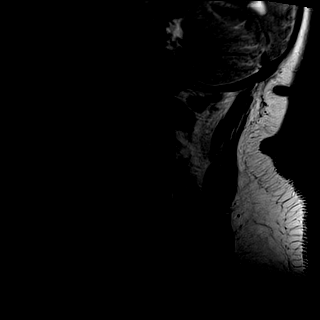

[Series 4: T1 · sagittal · 3.0mm · 0.41mm/px · 7 of 13 slices shown]
[im 1/13]
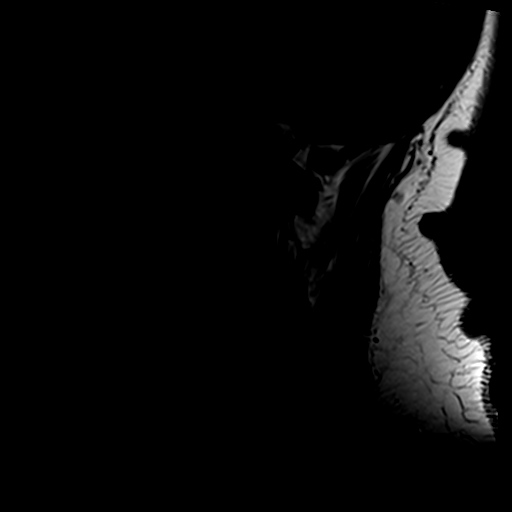
[im 3/13]
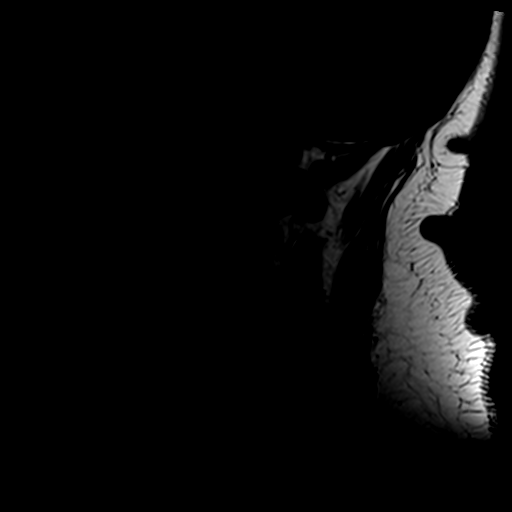
[im 5/13]
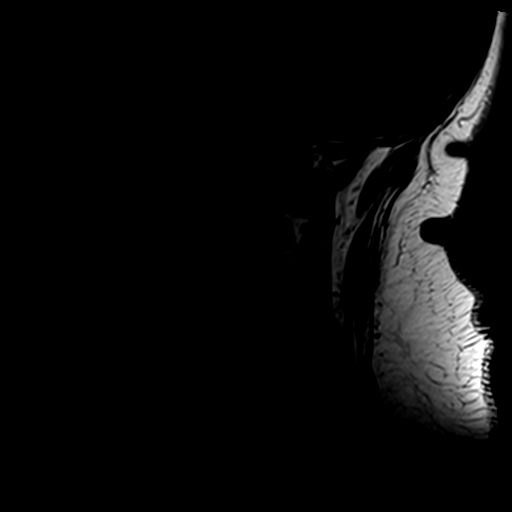
[im 7/13]
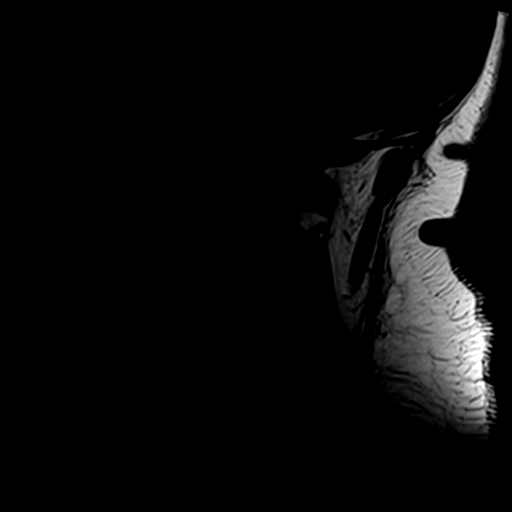
[im 9/13]
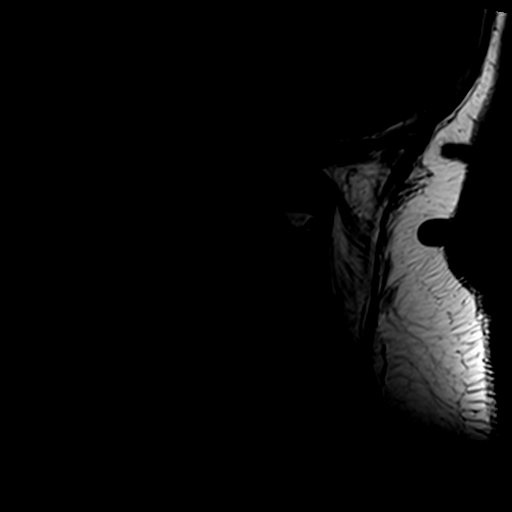
[im 11/13]
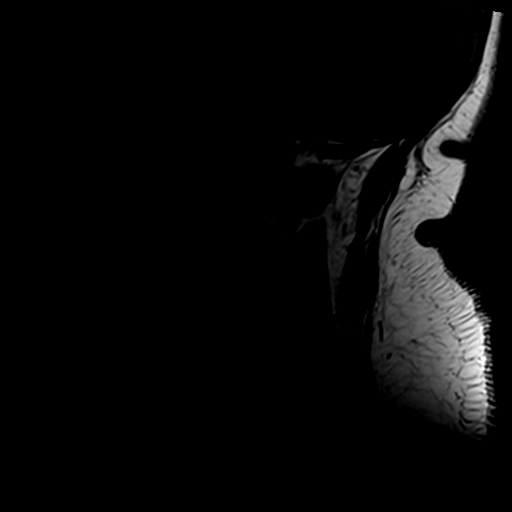
[im 13/13]
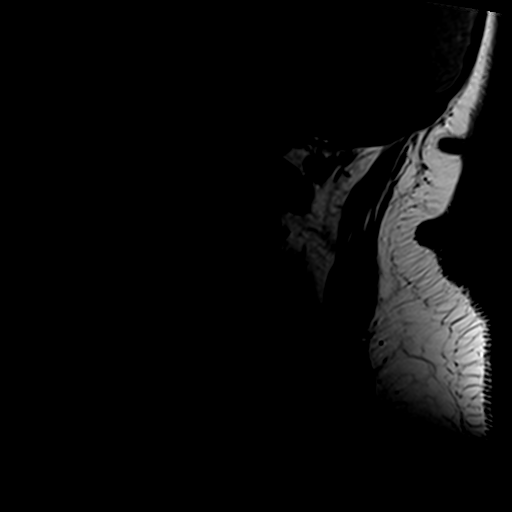

[Series 5: tir sag · sagittal · 3.0mm · 0.41mm/px · 7 of 13 slices shown]
[im 1/13]
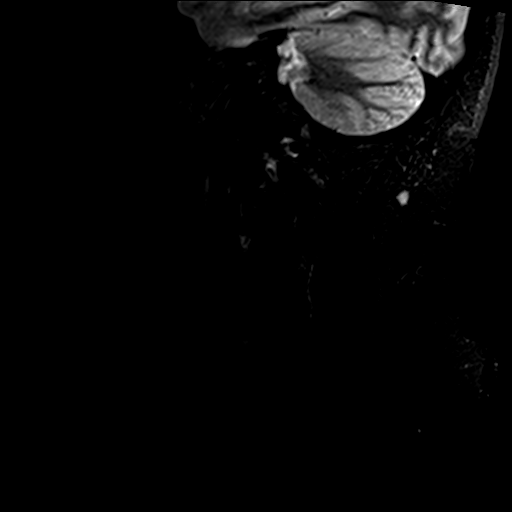
[im 3/13]
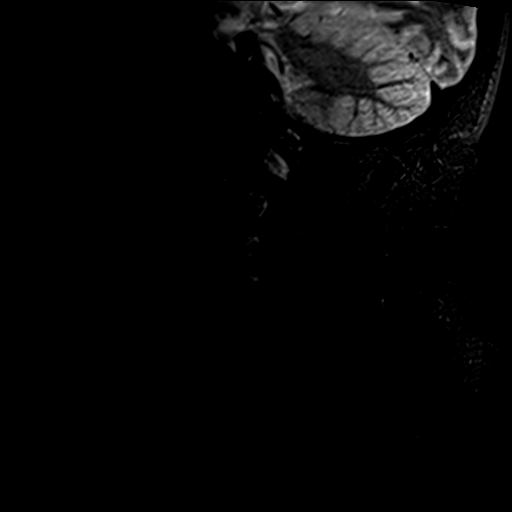
[im 5/13]
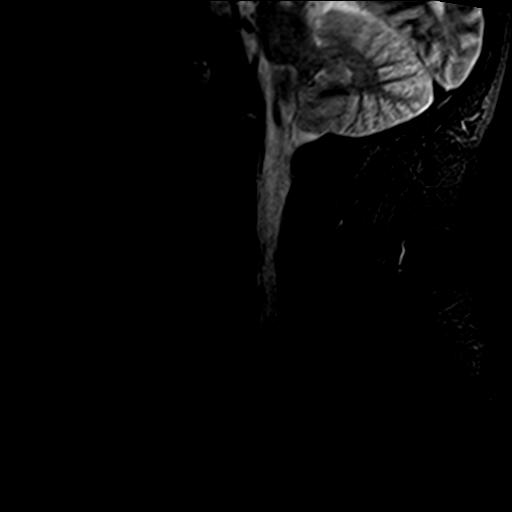
[im 7/13]
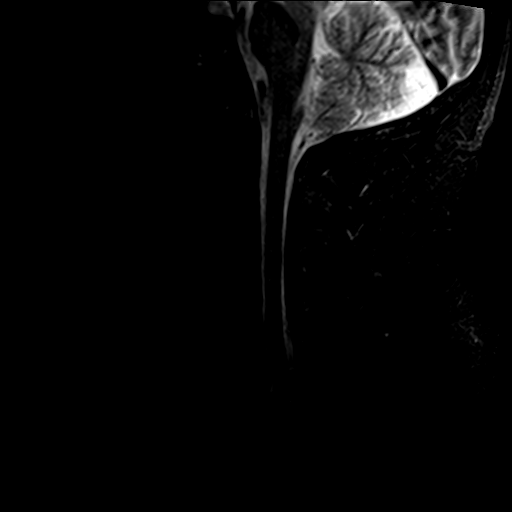
[im 9/13]
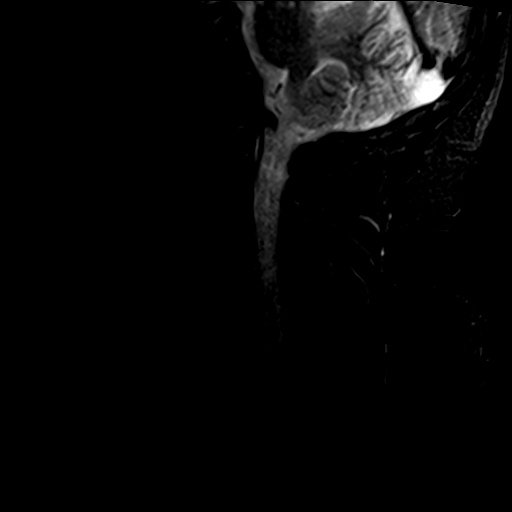
[im 11/13]
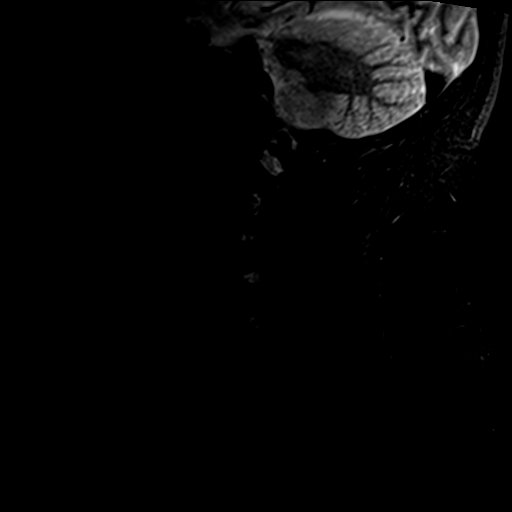
[im 13/13]
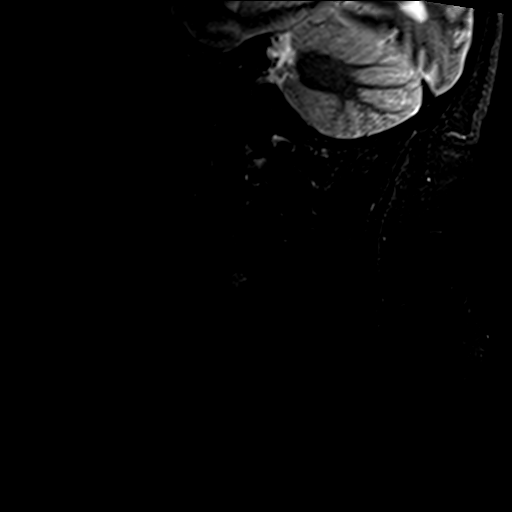

[Series 7: T2 · axial · 3.0mm · 0.78mm/px · z∈[-68,+33]mm · 8 of 28 slices shown (2 of 2)]
[im 1/28]
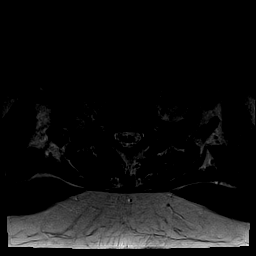
[im 5/28]
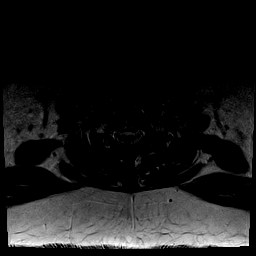
[im 9/28]
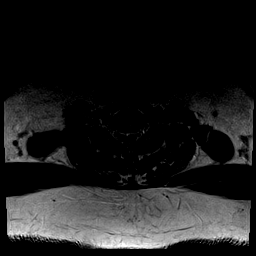
[im 13/28]
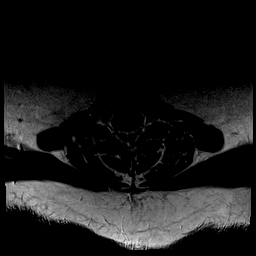
[im 15/28]
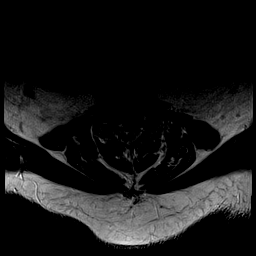
[im 19/28]
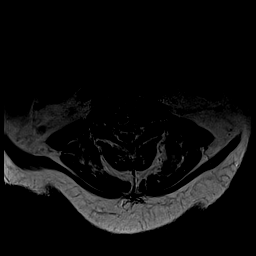
[im 23/28]
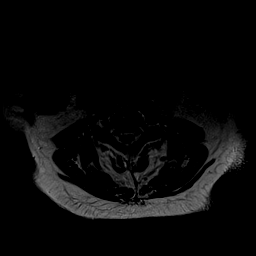
[im 28/28]
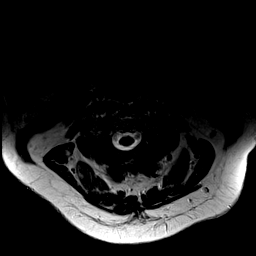

[28 of 48 positions shown; findings below may reference images not displayed]

FINDINGS: No abnormality at the foramen magnum, C1-2 or C2-3.

C3-4:  Mild bulging of the disc.  No canal or foraminal stenosis.

C4-5: Mild bulging of the disc. No stenosis of the canal or
foramina.

C5-6: Shallow central disc protrusion indents the ventral
subarachnoid space and does not compress the cord or show foraminal
extension.

C6-7: Broad-based disc herniation effaces the ventral subarachnoid
space but does not compress the cord. There is foraminal
encroachment bilaterally that could compress either or both C7 nerve
roots.

C7-T1:  Normal interspace.

No abnormal cord signal.
IMPRESSION: Broad-based disc herniation at C6-7 effacing the ventral
subarachnoid space but not compressing the cord. Foraminal
encroachment bilaterally could affect either or both C7 nerve roots.

## 2022-08-06 ENCOUNTER — Observation Stay (HOSPITAL_BASED_OUTPATIENT_CLINIC_OR_DEPARTMENT_OTHER)
Admission: EM | Admit: 2022-08-06 | Discharge: 2022-08-07 | Disposition: A | Discharge status: Home / Self Care | Payer: 59 | Attending: Internal Medicine | Admitting: Internal Medicine

## 2022-08-06 ENCOUNTER — Other Ambulatory Visit: Payer: Self-pay

## 2022-08-06 DIAGNOSIS — E1169 Type 2 diabetes mellitus with other specified complication: Secondary | ICD-10-CM | POA: Insufficient documentation

## 2022-08-06 DIAGNOSIS — Z7984 Long term (current) use of oral hypoglycemic drugs: Secondary | ICD-10-CM | POA: Diagnosis not present

## 2022-08-06 DIAGNOSIS — Z79899 Other long term (current) drug therapy: Secondary | ICD-10-CM | POA: Insufficient documentation

## 2022-08-06 DIAGNOSIS — Z8546 Personal history of malignant neoplasm of prostate: Secondary | ICD-10-CM | POA: Diagnosis not present

## 2022-08-06 DIAGNOSIS — G4733 Obstructive sleep apnea (adult) (pediatric): Secondary | ICD-10-CM

## 2022-08-06 DIAGNOSIS — E1110 Type 2 diabetes mellitus with ketoacidosis without coma: Principal | ICD-10-CM | POA: Insufficient documentation

## 2022-08-06 DIAGNOSIS — F17290 Nicotine dependence, other tobacco product, uncomplicated: Secondary | ICD-10-CM | POA: Diagnosis not present

## 2022-08-06 DIAGNOSIS — Z20822 Contact with and (suspected) exposure to covid-19: Secondary | ICD-10-CM | POA: Diagnosis not present

## 2022-08-06 DIAGNOSIS — E782 Mixed hyperlipidemia: Secondary | ICD-10-CM | POA: Diagnosis not present

## 2022-08-06 DIAGNOSIS — E1165 Type 2 diabetes mellitus with hyperglycemia: Secondary | ICD-10-CM | POA: Diagnosis present

## 2022-08-06 DIAGNOSIS — L03312 Cellulitis of back [any part except buttock]: Secondary | ICD-10-CM | POA: Insufficient documentation

## 2022-08-06 DIAGNOSIS — I10 Essential (primary) hypertension: Secondary | ICD-10-CM | POA: Diagnosis present

## 2022-08-06 DIAGNOSIS — K219 Gastro-esophageal reflux disease without esophagitis: Secondary | ICD-10-CM | POA: Diagnosis present

## 2022-08-06 LAB — I-STAT VENOUS BLOOD GAS, ED
Acid-base deficit: 8 mmol/L — ABNORMAL HIGH (ref 0.0–2.0)
Bicarbonate: 17 mmol/L — ABNORMAL LOW (ref 20.0–28.0)
Calcium, Ion: 1.26 mmol/L (ref 1.15–1.40)
HCT: 47 % (ref 39.0–52.0)
Hemoglobin: 16 g/dL (ref 13.0–17.0)
O2 Saturation: 54 %
Patient temperature: 98.4
Potassium: 4 mmol/L (ref 3.5–5.1)
Sodium: 134 mmol/L — ABNORMAL LOW (ref 135–145)
TCO2: 18 mmol/L — ABNORMAL LOW (ref 22–32)
pCO2, Ven: 34.5 mmHg — ABNORMAL LOW (ref 44–60)
pH, Ven: 7.301 (ref 7.25–7.43)
pO2, Ven: 31 mmHg — CL (ref 32–45)

## 2022-08-06 LAB — URINALYSIS, ROUTINE W REFLEX MICROSCOPIC
Glucose, UA: >=500 mg/dL — AB
Ketones, ur: >=80 mg/dL — AB
Leukocytes,Ua: NEGATIVE
Nitrite: NEGATIVE
Protein, ur: 30 mg/dL — AB
Specific Gravity, Urine: 1.02 (ref 1.005–1.030)
pH: 5 (ref 5.0–8.0)

## 2022-08-06 LAB — URINALYSIS, MICROSCOPIC (REFLEX)

## 2022-08-06 LAB — BASIC METABOLIC PANEL
Anion gap: 19 — ABNORMAL HIGH (ref 5–15)
Anion gap: 12 (ref 5–15)
BUN: 20 mg/dL (ref 8–23)
BUN: 16 mg/dL (ref 8–23)
CO2: 17 mmol/L — ABNORMAL LOW (ref 22–32)
CO2: 20 mmol/L — ABNORMAL LOW (ref 22–32)
Calcium: 9.7 mg/dL (ref 8.9–10.3)
Calcium: 8.8 mg/dL — ABNORMAL LOW (ref 8.9–10.3)
Chloride: 97 mmol/L — ABNORMAL LOW (ref 98–111)
Chloride: 105 mmol/L (ref 98–111)
Creatinine, Ser: 1.23 mg/dL (ref 0.61–1.24)
Creatinine, Ser: 0.95 mg/dL (ref 0.61–1.24)
GFR, Estimated: >60 mL/min (ref >60)
GFR, Estimated: >60 mL/min (ref >60)
Glucose, Bld: 484 mg/dL — ABNORMAL HIGH (ref 70–99)
Glucose, Bld: 158 mg/dL — ABNORMAL HIGH (ref 70–99)
Potassium: 4.3 mmol/L (ref 3.5–5.1)
Potassium: 3.6 mmol/L (ref 3.5–5.1)
Sodium: 133 mmol/L — ABNORMAL LOW (ref 135–145)
Sodium: 137 mmol/L (ref 135–145)

## 2022-08-06 LAB — CBG MONITORING, ED
Glucose-Capillary: 443 mg/dL — ABNORMAL HIGH (ref 70–99)
Glucose-Capillary: 342 mg/dL — ABNORMAL HIGH (ref 70–99)
Glucose-Capillary: 294 mg/dL — ABNORMAL HIGH (ref 70–99)
Glucose-Capillary: 197 mg/dL — ABNORMAL HIGH (ref 70–99)
Glucose-Capillary: 184 mg/dL — ABNORMAL HIGH (ref 70–99)

## 2022-08-06 LAB — CBC
HCT: 47.6 % (ref 39.0–52.0)
Hemoglobin: 16.2 g/dL (ref 13.0–17.0)
MCH: 30.1 pg (ref 26.0–34.0)
MCHC: 34 g/dL (ref 30.0–36.0)
MCV: 88.3 fL (ref 80.0–100.0)
Platelets: 261 10*3/uL (ref 150–400)
RBC: 5.39 MIL/uL (ref 4.22–5.81)
RDW: 13.1 % (ref 11.5–15.5)
WBC: 7.9 10*3/uL (ref 4.0–10.5)
nRBC: 0 % (ref 0.0–0.2)

## 2022-08-06 LAB — BETA-HYDROXYBUTYRIC ACID: Beta-Hydroxybutyric Acid: 7.94 mmol/L — ABNORMAL HIGH (ref 0.05–0.27)

## 2022-08-06 LAB — GLUCOSE, CAPILLARY
Glucose-Capillary: 157 mg/dL — ABNORMAL HIGH (ref 70–99)
Glucose-Capillary: 175 mg/dL — ABNORMAL HIGH (ref 70–99)

## 2022-08-06 MED ORDER — INSULIN REGULAR(HUMAN) IN NACL 100-0.9 UT/100ML-% IV SOLN
INTRAVENOUS | Status: DC
  Administered 2022-08-06: 15 [IU]/h via INTRAVENOUS
  Filled 2022-08-06: qty 100

## 2022-08-06 MED ORDER — LACTATED RINGERS IV SOLN: INTRAVENOUS | Status: DC

## 2022-08-06 MED ORDER — LISINOPRIL-HYDROCHLOROTHIAZIDE 20-25 MG PO TABS: 1.0000 | ORAL_TABLET | Freq: Every day | ORAL | Status: DC

## 2022-08-06 MED ORDER — LISINOPRIL 10 MG PO TABS
20.0000 mg | ORAL_TABLET | Freq: Every day | ORAL | Status: DC
  Administered 2022-08-06: 20 mg via ORAL
  Filled 2022-08-06: qty 2

## 2022-08-06 MED ORDER — HYDROCHLOROTHIAZIDE 25 MG PO TABS
25.0000 mg | ORAL_TABLET | Freq: Every day | ORAL | Status: DC
  Administered 2022-08-06: 25 mg via ORAL
  Filled 2022-08-06: qty 1

## 2022-08-06 MED ORDER — DEXTROSE 50 % IV SOLN: 0.0000 mL | INTRAVENOUS | Status: DC | PRN

## 2022-08-06 MED ORDER — DEXTROSE IN LACTATED RINGERS 5 % IV SOLN: INTRAVENOUS | Status: DC

## 2022-08-06 MED ORDER — ORAL CARE MOUTH RINSE: 15.0000 mL | OROMUCOSAL | Status: DC | PRN

## 2022-08-06 MED ORDER — CHLORHEXIDINE GLUCONATE CLOTH 2 % EX PADS
6.0000 | MEDICATED_PAD | Freq: Every day | CUTANEOUS | Status: DC
  Administered 2022-08-07: 6 via TOPICAL

## 2022-08-06 MED ORDER — SODIUM CHLORIDE 0.9 % IV BOLUS
1000.0000 mL | Freq: Once | INTRAVENOUS | Status: AC
  Administered 2022-08-06: 1000 mL via INTRAVENOUS

## 2022-08-06 MED ORDER — POTASSIUM CHLORIDE 10 MEQ/100ML IV SOLN
10.0000 meq | INTRAVENOUS | Status: AC
  Administered 2022-08-06 (×2): 10 meq via INTRAVENOUS
  Filled 2022-08-06 (×2): qty 100

## 2022-08-06 MED ORDER — LACTATED RINGERS IV BOLUS
20.0000 mL/kg | Freq: Once | INTRAVENOUS | Status: AC
  Administered 2022-08-06: 1000 mL via INTRAVENOUS

## 2022-08-06 MED ORDER — INSULIN REGULAR(HUMAN) IN NACL 100-0.9 UT/100ML-% IV SOLN: INTRAVENOUS | Status: DC

## 2022-08-06 NOTE — Progress Notes (Signed)
Transferring facility: [facility] Requesting provider: Sherrill Raring, PA (EDP at [facility]) Reason for transfer: admission for further evaluation and management of dka.   61 year old male with medical history notable for type 2 diabetes mellitus, who presented to [facility] ED on 08/06/2022 complaining of hyperglycemia on home glucometer over the last few days.  The patient reports recent prescription modifications to his outpatient diabetic regimen, with which she has been compliant.  However, over the last few days, he has noted progressive hyperglycemia associate with polyuria and generalized weakness.  No associated any chest pain, fever, chills.  Labs were notable for BMP which showed bicarbonate 17, anion gap 19, glucose 484.  Beta hydroxybutyric acid has been ordered, with result currently pending.  Urinalysis notable for greater than 80 ketones.  Medications administered prior to transfer included the following: IV fluids plus initiation of insulin drip per Endo tool.   Subsequently, I accepted this patient for transfer for observation to a stepdown unit bed at [facility] or [facility] (first available) for further work-up and management of suspected DKA.      Check [email or web address] for on-call coverage.   Nursing staff, Please call [facility] number on Amion as soon as patient's arrival, so appropriate admitting provider can evaluate the pt.     Babs Bertin, DO Hospitalist

## 2022-08-06 NOTE — ED Provider Notes (Signed)
Patient case signed out to me pending return call from hospitalist.  I spoke with admitting hospitalist except the patient for DKA work-up.   Sherrill Raring, PA-C 08/06/22 1945    Malvin Johns, MD 08/06/22 2046

## 2022-08-06 NOTE — ED Provider Notes (Signed)
[facility] EMERGENCY DEPARTMENT Provider Note   CSN: [id number] Arrival date & time: 08/06/22  1038     History  Chief Complaint  Patient presents with   Hyperglycemia    Jerry Griffith is a 61 y.o. male.  Patient with history of diabetes presents today with complaints of hyperglycemia. He states that he was an insulin dependent type 2 diabetic until 3 years ago when he had gastric bypass surgery and began to diet and exercise strictly and was able to come off all diabetes medications. States that at the end of July he began to have polydipsia and polyuria and generalized fatigue and went to his pcp for same and was found to have sugars in the 400s and an A1c of 10. Was subsequently started on metformin for same which he has been taking as prescribed. However, when he checks his blood sugars they are still in the 400s. He states that he is continuing to feel 'terrible' with no energy and polydipsia and polyuria. Presents today for same. He denies any nausea, vomiting, diarrhea, or abdominal pain.   The history is provided by the patient. No language interpreter was used.  Hyperglycemia Associated symptoms: increased thirst and polyuria        Home Medications Prior to Admission medications   Medication Sig Start Date End Date Taking? Authorizing Provider  fluticasone (FLONASE) 50 MCG/ACT nasal spray Place 1 spray into both nostrils daily.    [provider]  HYDROcodone-acetaminophen [facility]) 5-325 MG tablet 1-2 tabs po q6 hours prn pain 04/05/16   Leanora Cover, MD  lisinopril-hydrochlorothiazide (PRINZIDE,ZESTORETIC) 20-25 MG tablet Take 1 tablet by mouth daily.    [provider]  metFORMIN (GLUCOPHAGE) 500 MG tablet Take 500 mg by mouth daily with breakfast.     [provider]  Multiple Vitamin (MULTIVITAMIN) capsule Take 1 capsule by mouth daily.    [provider]  Omega-3 1000 MG CAPS Take 1 g by mouth daily.    [provider]  vitamin C (ASCORBIC ACID) 250 MG tablet Take 250 mg by mouth daily.    [provider]      Allergies    Patient has no known allergies.    Review of Systems   Review of Systems  Endocrine: Positive for polydipsia and polyuria.  All other systems reviewed and are negative.   Physical Exam Updated Vital Signs BP 133/64   Pulse 89   Temp 98.3 F (36.8 C) (Oral)   Resp 16   Ht 5' 8.5" (1.74 m)   Wt 113.4 kg   SpO2 100%   BMI 37.46 kg/m  Physical Exam Vitals and nursing note reviewed.  Constitutional:      General: He is not in acute distress.    Appearance: Normal appearance. He is normal weight. He is not ill-appearing, toxic-appearing or diaphoretic.  HENT:     Head: Normocephalic and atraumatic.  Cardiovascular:     Rate and Rhythm: Normal rate.  Pulmonary:     Effort: Pulmonary effort is normal. No respiratory distress.  Abdominal:     General: Abdomen is flat.     Palpations: Abdomen is soft.  Musculoskeletal:        General: Normal range of motion.     Cervical back: Normal range of motion.  Skin:    General: Skin is warm and dry.  Neurological:     General: No focal deficit present.     Mental Status: He is alert.  Psychiatric:  Mood and Affect: Mood normal.        Behavior: Behavior normal.     ED Results / Procedures / Treatments   Labs (all labs ordered are listed, but only abnormal results are displayed) Labs Reviewed  BASIC METABOLIC PANEL - Abnormal; Notable for the following components:      Result Value   Sodium 133 (*)    Chloride 97 (*)    CO2 17 (*)    Glucose, Bld 484 (*)    Anion gap 19 (*)    All other components within normal limits  URINALYSIS, ROUTINE W REFLEX MICROSCOPIC - Abnormal; Notable for the following components:   Glucose, UA >=500 (*)    Hgb urine dipstick TRACE (*)    Bilirubin Urine SMALL (*)    Ketones, ur >=80 (*)    Protein, ur 30 (*)    All other components within normal limits   URINALYSIS, MICROSCOPIC (REFLEX) - Abnormal; Notable for the following components:   Bacteria, UA MANY (*)    All other components within normal limits  CBG MONITORING, ED - Abnormal; Notable for the following components:   Glucose-Capillary 443 (*)    All other components within normal limits  CBG MONITORING, ED - Abnormal; Notable for the following components:   Glucose-Capillary 342 (*)    All other components within normal limits  I-STAT VENOUS BLOOD GAS, ED - Abnormal; Notable for the following components:   pCO2, Ven 34.5 (*)    pO2, Ven 31 (*)    Bicarbonate 17.0 (*)    TCO2 18 (*)    Acid-base deficit 8.0 (*)    Sodium 134 (*)    All other components within normal limits  CBG MONITORING, ED - Abnormal; Notable for the following components:   Glucose-Capillary 294 (*)    All other components within normal limits  CBC  BETA-HYDROXYBUTYRIC ACID  BLOOD GAS, VENOUS    EKG None  Radiology No results found.  Procedures Procedures    Medications Ordered in ED Medications  insulin regular, human (MYXREDLIN) 100 units/ 100 mL infusion (15 Units/hr Intravenous Rate/Dose Verify 08/06/22 1823)  lactated ringers infusion (0 mLs Intravenous Hold 08/06/22 1825)  dextrose 5 % in lactated ringers infusion (0 mLs Intravenous Hold 08/06/22 1721)  dextrose 50 % solution 0-50 mL (has no administration in time range)  potassium chloride 10 mEq in 100 mL IVPB (10 mEq Intravenous New Bag/Given 08/06/22 1822)  sodium chloride 0.9 % bolus 1,000 mL (0 mLs Intravenous Stopped 08/06/22 1645)  lactated ringers bolus 2,268 mL (1,000 mLs Intravenous New Bag/Given 08/06/22 1716)    ED Course/ Medical Decision Making/ A&P                           Medical Decision Making Amount and/or Complexity of Data Reviewed Labs: ordered.  Risk Prescription drug management. Decision regarding hospitalization.   This patient presents to the ED for concern of hyperglycemia, this involves an extensive  number of treatment options, and is a complaint that carries with it a high risk of complications and morbidity.  The differential diagnosis includes DKA/HHS, hyperglycemia   Co morbidities that complicate the patient evaluation  Hx diabetes   Additional history obtained:  Additional history obtained from epic chart review   Lab Tests:  I Ordered, and personally interpreted labs.  The pertinent results include:  CBG 443, UA with ketonuria, glucosuria, proteinuria. K 4.3, Na 133, bicarb 17, anion gap 19.  Vbg shows pco2 34.5, bicarb 17   Problem List / ED Course / Critical interventions / Medication management  I ordered medication including insulin and fluids  for DKA  Reevaluation of the patient after these medicines showed that the patient improved I have reviewed the patients home medicines and have made adjustments as needed   Test / Admission - Considered:  Patient presents today with complaints of DKA. He is afebrile, non-toxic appearing, and in no acute distress with reassuring vital signs. Found to be in DKA, anion gap 19, bicarb 17. Will need admission for same. Patient is alert and oriented and neurologically intact and hemodynamically stable. He is understanding and amenable with admission plan.  Discussed patient with hospitalist who agrees to admit   Final Clinical Impression(s) / ED Diagnoses Final diagnoses:  Diabetic ketoacidosis without coma associated with type 2 diabetes mellitus [facility])    Rx / DC Orders ED Discharge Orders     None         Nestor Lewandowsky 08/07/22 1225    Malvin Johns, MD 08/15/22 [phone]

## 2022-08-06 NOTE — ED Triage Notes (Signed)
States hx of diabetes, has been off medication for 3 years as he was doing well. Saw PCP at beginning of August as his BG was elevated, started on Metformin, continues to feel unwell.

## 2022-08-06 NOTE — ED Notes (Signed)
Pt c/o increased thirst, polydipsia. Reports having had nothing to eat today. Denies n/v/d at this time. Family at bedside. Call bell within reach, will continue to monitor.

## 2022-08-07 ENCOUNTER — Observation Stay (HOSPITAL_COMMUNITY): Payer: 59

## 2022-08-07 ENCOUNTER — Encounter (HOSPITAL_COMMUNITY): Payer: Self-pay | Admitting: Internal Medicine

## 2022-08-07 DIAGNOSIS — Z9989 Dependence on other enabling machines and devices: Secondary | ICD-10-CM

## 2022-08-07 DIAGNOSIS — I10 Essential (primary) hypertension: Secondary | ICD-10-CM | POA: Diagnosis not present

## 2022-08-07 DIAGNOSIS — E782 Mixed hyperlipidemia: Secondary | ICD-10-CM | POA: Diagnosis present

## 2022-08-07 DIAGNOSIS — G4733 Obstructive sleep apnea (adult) (pediatric): Secondary | ICD-10-CM

## 2022-08-07 DIAGNOSIS — E1110 Type 2 diabetes mellitus with ketoacidosis without coma: Secondary | ICD-10-CM | POA: Diagnosis not present

## 2022-08-07 DIAGNOSIS — L03312 Cellulitis of back [any part except buttock]: Secondary | ICD-10-CM | POA: Diagnosis present

## 2022-08-07 DIAGNOSIS — K219 Gastro-esophageal reflux disease without esophagitis: Secondary | ICD-10-CM | POA: Diagnosis present

## 2022-08-07 DIAGNOSIS — E1169 Type 2 diabetes mellitus with other specified complication: Secondary | ICD-10-CM

## 2022-08-07 LAB — BASIC METABOLIC PANEL
Anion gap: 9 (ref 5–15)
Anion gap: 8 (ref 5–15)
BUN: 21 mg/dL (ref 8–23)
BUN: 21 mg/dL (ref 8–23)
CO2: 24 mmol/L (ref 22–32)
CO2: 22 mmol/L (ref 22–32)
Calcium: 8.8 mg/dL — ABNORMAL LOW (ref 8.9–10.3)
Calcium: 8.8 mg/dL — ABNORMAL LOW (ref 8.9–10.3)
Chloride: 106 mmol/L (ref 98–111)
Chloride: 109 mmol/L (ref 98–111)
Creatinine, Ser: 1.32 mg/dL — ABNORMAL HIGH (ref 0.61–1.24)
Creatinine, Ser: 1.08 mg/dL (ref 0.61–1.24)
GFR, Estimated: >60 mL/min (ref >60)
GFR, Estimated: >60 mL/min (ref >60)
Glucose, Bld: 156 mg/dL — ABNORMAL HIGH (ref 70–99)
Glucose, Bld: 136 mg/dL — ABNORMAL HIGH (ref 70–99)
Potassium: 3.2 mmol/L — ABNORMAL LOW (ref 3.5–5.1)
Potassium: 3.5 mmol/L (ref 3.5–5.1)
Sodium: 139 mmol/L (ref 135–145)
Sodium: 139 mmol/L (ref 135–145)

## 2022-08-07 LAB — MAGNESIUM: Magnesium: 1.9 mg/dL (ref 1.7–2.4)

## 2022-08-07 LAB — GLUCOSE, CAPILLARY
Glucose-Capillary: 129 mg/dL — ABNORMAL HIGH (ref 70–99)
Glucose-Capillary: 150 mg/dL — ABNORMAL HIGH (ref 70–99)
Glucose-Capillary: 153 mg/dL — ABNORMAL HIGH (ref 70–99)
Glucose-Capillary: 157 mg/dL — ABNORMAL HIGH (ref 70–99)
Glucose-Capillary: 176 mg/dL — ABNORMAL HIGH (ref 70–99)
Glucose-Capillary: 190 mg/dL — ABNORMAL HIGH (ref 70–99)
Glucose-Capillary: 190 mg/dL — ABNORMAL HIGH (ref 70–99)
Glucose-Capillary: 195 mg/dL — ABNORMAL HIGH (ref 70–99)
Glucose-Capillary: 222 mg/dL — ABNORMAL HIGH (ref 70–99)

## 2022-08-07 LAB — HEMOGLOBIN A1C
Hgb A1c MFr Bld: 11.8 % — ABNORMAL HIGH (ref 4.8–5.6)
Mean Plasma Glucose: 291.96 mg/dL

## 2022-08-07 LAB — SARS CORONAVIRUS 2 BY RT PCR: SARS Coronavirus 2 by RT PCR: NEGATIVE

## 2022-08-07 LAB — HIV ANTIBODY (ROUTINE TESTING W REFLEX): HIV Screen 4th Generation wRfx: NONREACTIVE

## 2022-08-07 LAB — BETA-HYDROXYBUTYRIC ACID: Beta-Hydroxybutyric Acid: 1.22 mmol/L — ABNORMAL HIGH (ref 0.05–0.27)

## 2022-08-07 LAB — MRSA NEXT GEN BY PCR, NASAL: MRSA by PCR Next Gen: NOT DETECTED

## 2022-08-07 MED ORDER — ONDANSETRON HCL 4 MG PO TABS: 4.0000 mg | ORAL_TABLET | Freq: Four times a day (QID) | ORAL | Status: DC | PRN

## 2022-08-07 MED ORDER — POTASSIUM CHLORIDE CRYS ER 20 MEQ PO TBCR
40.0000 meq | EXTENDED_RELEASE_TABLET | Freq: Once | ORAL | Status: AC
  Administered 2022-08-07: 40 meq via ORAL
  Filled 2022-08-07: qty 2

## 2022-08-07 MED ORDER — CEPHALEXIN 500 MG PO CAPS
500.0000 mg | ORAL_CAPSULE | Freq: Four times a day (QID) | ORAL | Status: DC
  Administered 2022-08-07 (×2): 500 mg via ORAL
  Filled 2022-08-07 (×3): qty 1

## 2022-08-07 MED ORDER — BLOOD GLUCOSE MONITOR KIT: PACK | 0 refills | Status: AC

## 2022-08-07 MED ORDER — ACETAMINOPHEN 650 MG RE SUPP: 650.0000 mg | Freq: Four times a day (QID) | RECTAL | Status: DC | PRN

## 2022-08-07 MED ORDER — ACETAMINOPHEN 325 MG PO TABS: 650.0000 mg | ORAL_TABLET | Freq: Four times a day (QID) | ORAL | Status: DC | PRN

## 2022-08-07 MED ORDER — ENOXAPARIN SODIUM 60 MG/0.6ML IJ SOSY
55.0000 mg | PREFILLED_SYRINGE | INTRAMUSCULAR | Status: DC
Start: 2022-08-07 — End: 2022-08-07
  Administered 2022-08-07: 55 mg via SUBCUTANEOUS
  Filled 2022-08-07: qty 0.6

## 2022-08-07 MED ORDER — INSULIN ASPART 100 UNIT/ML IJ SOLN: 0.0000 [IU] | Freq: Every day | INTRAMUSCULAR | Status: DC

## 2022-08-07 MED ORDER — INSULIN GLARGINE-YFGN 100 UNIT/ML ~~LOC~~ SOLN
30.0000 [IU] | Freq: Every day | SUBCUTANEOUS | Status: DC
Start: 2022-08-07 — End: 2022-08-07
  Administered 2022-08-07: 30 [IU] via SUBCUTANEOUS
  Filled 2022-08-07 (×2): qty 0.3

## 2022-08-07 MED ORDER — CEPHALEXIN 500 MG PO CAPS: 500.0000 mg | ORAL_CAPSULE | Freq: Four times a day (QID) | ORAL | 0 refills | Status: AC

## 2022-08-07 MED ORDER — PANTOPRAZOLE SODIUM 40 MG PO TBEC
40.0000 mg | DELAYED_RELEASE_TABLET | Freq: Every day | ORAL | Status: DC
  Administered 2022-08-07: 40 mg via ORAL
  Filled 2022-08-07: qty 1

## 2022-08-07 MED ORDER — ONDANSETRON HCL 4 MG/2ML IJ SOLN: 4.0000 mg | Freq: Four times a day (QID) | INTRAMUSCULAR | Status: DC | PRN

## 2022-08-07 MED ORDER — ATORVASTATIN CALCIUM 10 MG PO TABS
20.0000 mg | ORAL_TABLET | Freq: Every day | ORAL | Status: DC
  Administered 2022-08-07: 20 mg via ORAL
  Filled 2022-08-07: qty 2

## 2022-08-07 MED ORDER — POLYETHYLENE GLYCOL 3350 17 G PO PACK: 17.0000 g | PACK | Freq: Every day | ORAL | Status: DC | PRN

## 2022-08-07 MED ORDER — LACTATED RINGERS IV BOLUS
500.0000 mL | Freq: Once | INTRAVENOUS | Status: AC
  Administered 2022-08-07: 500 mL via INTRAVENOUS

## 2022-08-07 MED ORDER — INSULIN ASPART 100 UNIT/ML IJ SOLN
0.0000 [IU] | Freq: Three times a day (TID) | INTRAMUSCULAR | Status: DC
  Administered 2022-08-07: 5 [IU] via SUBCUTANEOUS

## 2022-08-07 MED ORDER — INSULIN DETEMIR 100 UNIT/ML FLEXPEN: 30.0000 [IU] | PEN_INJECTOR | Freq: Every day | SUBCUTANEOUS | 0 refills | Status: DC

## 2022-08-07 MED ORDER — INSULIN ASPART 100 UNIT/ML FLEXPEN: 8.0000 [IU] | PEN_INJECTOR | Freq: Three times a day (TID) | SUBCUTANEOUS | 0 refills | Status: DC

## 2022-08-07 MED ORDER — "PEN NEEDLES 3/16"" 31G X 5 MM MISC": 1.0000 | Freq: Four times a day (QID) | 1 refills | Status: AC

## 2022-08-07 NOTE — Progress Notes (Signed)
Reviewed discharge instructions with pt. Pt verbalized understanding. Belongings returned to pt. Reviewed w/ Diabetes coordinator. Received long acting insulin at 11 AM. Pt will continue to take long-acting insulin in the morning.

## 2022-08-07 NOTE — Assessment & Plan Note (Signed)
   Mild cellulitis of the mid back surrounding a surgical wound status post incision and drainage of a large cyst on his back 2 months ago.  Patient reports as of late he has had some back discomfort as well as intermittent purulent drainage from the wound.  On exam very mild cellulitis noted.  As a precaution we will go ahead and place patient on a short course of oral Keflex.

## 2022-08-07 NOTE — Assessment & Plan Note (Signed)
   Continue home regimen of statin therapy 

## 2022-08-07 NOTE — H&P (Signed)
History and Physical    Patient: Jerry Griffith MRN: [id number] DOA: 08/06/2022  Date of Service: the patient was seen and examined on 08/07/2022  Patient coming from: Home via [facility] ED  Chief Complaint:  Chief Complaint  Patient presents with   Hyperglycemia    HPI:   61 year old male with past medical history of gastric bypass surgery, hyperlipidemia, non-insulin-dependent diabetes mellitus type 2, gastroesophageal reflux disease, hypertension presenting to [place] emergency department with a several week history of progressively worsening generalized malaise weakness and polydipsia.  Patient explains that for approximately the past month he has been experiencing generalized malaise and weakness.  This was initially mild in intensity but progressively has become more and more severe.  Over the same span of time patient has experienced associated polydipsia and polyuria as well as gradual unintentional weight loss.  Patient denies fevers, shortness of breath, chest pain, abdominal pain, dysuria, sick contacts, recent travel or contact with confirmed COVID-19 infection.  Patient also explains that approximately 2 months ago he had a large cyst over his mid back surgically incised.  While the wound has gradually improved the patient continues to experience some discomfort and occasional drainage from the wound in his back.  He explains that the other day his wife expressed a large amount of pus from the wound.  Patient denies any fever.  Due to patient's progressively worsening polydipsia polyuria and weakness he presented to see his primary care provider the first week of August and was noted to be markedly hyperglycemic with blood sugar in excess of 400.  His PCP placed him on metformin.  Despite this, in the weeks that followed patient's blood sugars continue to remain high and his symptoms continue to worsen.  Patient eventually presented to [place] emergency  department for evaluation.  Upon evaluation in the emergency department patient was clinically found to be in diabetic ketoacidosis with an elevated anion gap, elevated beta hydroxybutyrate and markedly elevated blood sugars.  Patient was placed on an insulin infusion as well as intravenous fluids and the hospitalist group was called who accepted the patient for transfer to [facility] long hospital for continued medical care.   Review of Systems: Review of Systems  Constitutional:  Positive for malaise/fatigue.  Genitourinary:  Positive for frequency.  Neurological:  Positive for weakness.  Endo/Heme/Allergies:  Positive for polydipsia.  All other systems reviewed and are negative.    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   Sinus congestion    Sleep apnea    cpap    Past Surgical History:  Procedure Laterality Date   CARPAL TUNNEL RELEASE Right 04/05/2016   Procedure: RIGHT CARPAL TUNNEL RELEASE;  Surgeon: Leanora Cover, MD;  Location: [facility];  Service: Orthopedics;  Laterality: Right;   PROSTATE SURGERY  2/14   TONSILLECTOMY      Social History:  reports that he has been smoking cigars. He has never used smokeless tobacco. He reports that he does not drink alcohol and does not use drugs.  No Known Allergies  Family History  Problem Relation Age of Onset   Heart disease Neg Hx     Prior to Admission medications   Medication Sig Start Date End Date Taking? Authorizing Provider  atorvastatin (  LIPITOR) 20 MG tablet Take 20 mg by mouth daily. 07/05/22  Yes [provider]  cholecalciferol (VITAMIN D3) 25 MCG (1000 UNIT) tablet Take 1,000 Units by mouth daily.   Yes [provider]  fluticasone (FLONASE) 50 MCG/ACT nasal spray Place 1 spray into both nostrils daily as needed for allergies.   Yes  [provider]  l-methylfolate-B6-B12 (METANX) 3-35-2 MG TABS tablet Take 1 tablet by mouth daily.   Yes [provider]  metFORMIN (GLUCOPHAGE) 500 MG tablet Take 500 mg by mouth daily with breakfast.    Yes [provider]  Multiple Vitamin (MULTIVITAMIN) capsule Take 1 capsule by mouth daily.   Yes [provider]  Omega-3 1000 MG CAPS Take 1 g by mouth daily.   Yes [provider]  omeprazole (PRILOSEC) 40 MG capsule Take 40 mg by mouth daily.   Yes [provider]  vitamin B-12 (CYANOCOBALAMIN) 100 MCG tablet Take 100 mcg by mouth daily.   Yes [provider]  vitamin C (ASCORBIC ACID) 250 MG tablet Take 250 mg by mouth daily.   Yes [provider]  HYDROcodone-acetaminophen (NORCO) 5-325 MG tablet 1-2 tabs po q6 hours prn pain Patient not taking: Reported on 08/06/2022 04/05/16   Leanora Cover, MD    Physical Exam:  Vitals:   08/07/22 0200 08/07/22 0230 08/07/22 0245 08/07/22 0300  BP: (!) 83/40 (!) 89/40 (!) 96/46 (!) 112/43  Pulse: 79 78 76 75  Resp: '13 10 12 12  '$ Temp:      TempSrc:      SpO2: 97% 97% 98% 98%  Weight:      Height:        Constitutional: Awake alert and oriented x3, no associated distress.   Skin: Midline thoracic back postsurgical wound with active serosanguineous and occasionally purulent drainage.  Some surrounding induration tenderness and redness noted.  Poor skin turgor noted.. Eyes: Pupils are equally reactive to light.  No evidence of scleral icterus or conjunctival pallor.  ENMT: Moist mucous membranes noted.  Posterior pharynx clear of any exudate or lesions.   Neck: normal, supple, no masses, no thyromegaly.  No evidence of jugular venous distension.   Respiratory: clear to auscultation bilaterally, no wheezing, no crackles. Normal respiratory effort. No accessory muscle use.  Cardiovascular: Regular rate and rhythm, no murmurs / rubs / gallops. No extremity edema. 2+ pedal pulses.  No carotid bruits.  Chest:   Nontender without crepitus or deformity.   Back:   Nontender without crepitus or deformity. Abdomen: Abdomen is soft and nontender.  No evidence of intra-abdominal masses.  Positive bowel sounds noted in all quadrants.   Musculoskeletal: No joint deformity upper and lower extremities. Good ROM, no contractures. Normal muscle tone.  Neurologic: CN 2-12 grossly intact. Sensation intact.  Patient moving all 4 extremities spontaneously.  Patient is following all commands.  Patient is responsive to verbal stimuli.   Psychiatric: Patient exhibits normal mood with appropriate affect.  Patient seems to possess insight as to their current situation.    Data Reviewed:  I have personally reviewed and interpreted labs, imaging.  Significant findings are:  Comes reveals sodium 133, potassium 4.3, chloride 97, bicarbonate of 17, BUN 20, creatinine 1.23 CBC revealing white blood cell count of 7.9, hemoglobin 16.2, hematocrit 47.6, platelet count 261 Beta-hydroxybutyrate of 7.94 VBG revealing pH of 7.301, PCO2 of 34.5   Telemetry: Personally reviewed.  Rhythm is normal sinus rhythm with heart rate of 90 bpm.    Assessment  and Plan: * Uncontrolled type 2 diabetes mellitus with ketoacidosis without coma, without long-term current use of insulin ([place]) Patient presenting with elevated anion gap, elevated beta hydroxybutyrate and severe hyperglycemia consistent with diabetic ketoacidosis Inciting event of recurrent diabetes with diabetic ketoacidosis may have been a mild cellulitis of the mid back. Patient's been placed on insulin infusion Patient is being hydrated aggressively with intravenous isotonic fluids Performing serial chemistries and serial beta hydroxybutyrate levels Patient is currently n.p.o. with exception of ice chips and sips of water Patient will remain on insulin infusion until anion gap is closed    Cellulitis of mid back region Mild cellulitis of the mid  back surrounding a surgical wound status post incision and drainage of a large cyst on his back 2 months ago. Patient reports as of late he has had some back discomfort as well as intermittent purulent drainage from the wound. On exam very mild cellulitis noted.  As a precaution we will go ahead and place patient on a short course of oral Keflex.  Essential hypertension Holding home regimen of antihypertensives since patient is either hypotensive or normotensive We will resume antihypertensive regimen as necessary  Mixed diabetic hyperlipidemia associated with type 2 diabetes mellitus ([place]) Continue home regimen of statin therapy  GERD without esophagitis Daily proton pump inhibitor  OSA on CPAP Continuing home regimen of CPAP nightly    Code Status:  Full code  code status decision has been confirmed with: patient Family Communication: deferred   Consults: none  Severity of Illness:  The appropriate patient status for this patient is OBSERVATION. Observation status is judged to be reasonable and necessary in order to provide the required intensity of service to ensure the patient's safety. The patient's presenting symptoms, physical exam findings, and initial radiographic and laboratory data in the context of their medical condition is felt to place them at decreased risk for further clinical deterioration. Furthermore, it is anticipated that the patient will be medically stable for discharge from the hospital within 2 midnights of admission.   Author:  Vernelle Emerald MD  08/07/2022 3:36 AM

## 2022-08-07 NOTE — Assessment & Plan Note (Signed)
.   Continuing home regimen of CPAP nightly  

## 2022-08-07 NOTE — Assessment & Plan Note (Signed)
   Holding home regimen of antihypertensives since patient is either hypotensive or normotensive  We will resume antihypertensive regimen as necessary

## 2022-08-07 NOTE — Assessment & Plan Note (Signed)
   Daily proton pump inhibitor

## 2022-08-07 NOTE — TOC Progression Note (Signed)
Transition of Care [facility]) - Progression Note    Patient Details  Name: Jerry Griffith MRN: [id number] Date of Birth: 01-20-1961  Transition of Care [facility]) CM/SW Contact  Purcell Mouton, RN Phone Number: 08/07/2022, 8:55 AM  Clinical Narrative:      Transition of Care (TOC) Screening Note   Patient Details  Name: Jerry Griffith Date of Birth: 1961/08/09   Transition of Care [facility]) CM/SW Contact:    Purcell Mouton, RN Phone Number: 08/07/2022, 8:56 AM    Transition of Care Department [facility]) has reviewed patient and no TOC needs have been identified at this time. We will continue to monitor patient advancement through interdisciplinary progression rounds. If new patient transition needs arise, please place a TOC consult.         Expected Discharge Plan and Services                                                 Social Determinants of Health (SDOH) Interventions    Readmission Risk Interventions     No data to display

## 2022-08-07 NOTE — Assessment & Plan Note (Addendum)
   Patient presenting with elevated anion gap, elevated beta hydroxybutyrate and severe hyperglycemia consistent with diabetic ketoacidosis  Inciting event of recurrent diabetes with diabetic ketoacidosis may have been a mild cellulitis of the mid back.  Patient's been placed on insulin infusion  Patient is being hydrated aggressively with intravenous isotonic fluids  Performing serial chemistries and serial beta hydroxybutyrate levels  Patient is currently n.p.o. with exception of ice chips and sips of water  Patient will remain on insulin infusion until anion gap is closed

## 2022-08-07 NOTE — Inpatient Diabetes Management (Signed)
Inpatient Diabetes Program Recommendations  AACE/ADA: New Consensus Statement on Inpatient Glycemic Control (2015)  Target Ranges:  Prepandial:   less than 140 mg/dL      Peak postprandial:   less than 180 mg/dL (1-2 hours)      Critically ill patients:  140 - 180 mg/dL   Lab Results  Component Value Date   EVOJJK 093 (H) 08/07/2022   HGBA1C 11.8 (H) 08/07/2022    Review of Glycemic Control  Diabetes history: DM2 Outpatient Diabetes medications: metformin 500 mg QD Current orders for Inpatient glycemic control: Semglee 30 QD, Novolog 0-15 TID with meals and 0-5 HS   Inpatient Diabetes Program Recommendations:    Discharge orders:   Levemir 30 units QAM Novolog 8 units TID with meals Metformin 500 with breakfast  Monitor blood sugars 3-4x/day F/U with PCP/Endo for HgbA1C of 11.8%  Spoke with pt at bedside regarding his HgbA1C of 11.8%. Only on metformin 500 QAM at home. Likely from wound infection, dietary indiscretions. Has meter to check blood sugars at least 3-4x/day. Discussed importance of getting HgbA1C down to < 7% to reduce risks of complications. Reviewed hypoglycemia s/s and treatment. Discussed  impact of nutrition, exercise, stress, sickness, and medications on diabetes control. Answered all questions.   Discussed above with MD, RN.  Thank you. Lorenda Peck, RD, LDN, [place] Inpatient Diabetes Coordinator [phone]

## 2022-08-07 NOTE — Discharge Summary (Signed)
Physician Discharge Summary  Jerry Griffith JSE:[id number] DOB: 28-May-1961 DOA: 08/06/2022  PCP: Wallene Dales, MD  Admit date: 08/06/2022 Discharge date: 08/07/2022  Admitted From: Home Disposition:  Home   Recommendations for Outpatient Follow-up:  Follow up with PCP in 1 week. Need blood sugar follow up.  Referral to endocrinology and diabetes education placed.   Discharge Condition: Stable CODE STATUS: Full  Diet recommendation: Carb modified   Brief/Interim Summary: From H&P by Dr. Cyd Silence: "61 year old male with past medical history of gastric bypass surgery, hyperlipidemia, non-insulin-dependent diabetes mellitus type 2, gastroesophageal reflux disease, hypertension presenting to [place] emergency department with a several week history of progressively worsening generalized malaise weakness and polydipsia.   Patient explains that for approximately the past month he has been experiencing generalized malaise and weakness.  This was initially mild in intensity but progressively has become more and more severe.  Over the same span of time patient has experienced associated polydipsia and polyuria as well as gradual unintentional weight loss.  Patient denies fevers, shortness of breath, chest pain, abdominal pain, dysuria, sick contacts, recent travel or contact with confirmed COVID-19 infection.   Patient also explains that approximately 2 months ago he had a large cyst over his mid back surgically incised.  While the wound has gradually improved the patient continues to experience some discomfort and occasional drainage from the wound in his back.  He explains that the other day his wife expressed a large amount of pus from the wound.  Patient denies any fever.   Due to patient's progressively worsening polydipsia polyuria and weakness he presented to see his primary care provider the first week of August and was noted to be markedly hyperglycemic with blood sugar in excess  of 400.  His PCP placed him on metformin.  Despite this, in the weeks that followed patient's blood sugars continue to remain high and his symptoms continue to worsen.  Patient eventually presented to [place] emergency department for evaluation.   Upon evaluation in the emergency department patient was clinically found to be in diabetic ketoacidosis with an elevated anion gap, elevated beta hydroxybutyrate and markedly elevated blood sugars.  Patient was placed on an insulin infusion as well as intravenous fluids and the hospitalist group was called who accepted the patient for transfer to [facility] long hospital for continued medical care."  Subjective on day of discharge: Patient was transitioned from insulin drip to subcutaneous insulin once anion gap closed. Diabetic coordinator was consulted. He was given course of keflex for cellulitis in area of cyst excision. His blood sugar was improved and he was discharged home in good condition.   Discharge Diagnoses:   Principal Problem:   Uncontrolled type 2 diabetes mellitus with ketoacidosis without coma, without long-term current use of insulin (HCC) Active Problems:   Cellulitis of mid back region   Essential hypertension   Mixed diabetic hyperlipidemia associated with type 2 diabetes mellitus ([place])   GERD without esophagitis   OSA on CPAP    Discharge Instructions  Discharge Instructions     Ambulatory referral to Endocrinology   Complete by: As directed    Ambulatory referral to Nutrition and Diabetic Education   Complete by: As directed    Call MD for:  extreme fatigue   Complete by: As directed    Call MD for:  hives   Complete by: As directed    Call MD for:  persistant dizziness or light-headedness   Complete by: As directed  Call MD for:  persistant nausea and vomiting   Complete by: As directed    Call MD for:  severe uncontrolled pain   Complete by: As directed    Call MD for:  temperature >100.4   Complete  by: As directed    Diet Carb Modified   Complete by: As directed    Discharge instructions   Complete by: As directed    You were cared for by a hospitalist during your hospital stay. If you have any questions about your discharge medications or the care you received while you were in the hospital after you are discharged, you can call the unit and ask to speak with the hospitalist on call if the hospitalist that took care of you is not available. Once you are discharged, your primary care physician will handle any further medical issues. Please note that NO REFILLS for any discharge medications will be authorized once you are discharged, as it is imperative that you return to your primary care physician (or establish a relationship with a primary care physician if you do not have one) for your aftercare needs so that they can reassess your need for medications and monitor your lab values.   Increase activity slowly   Complete by: As directed       Allergies as of 08/07/2022   No Known Allergies      Medication List     STOP taking these medications    HYDROcodone-acetaminophen 5-325 MG tablet Commonly known as: Norco       TAKE these medications    atorvastatin 20 MG tablet Commonly known as: LIPITOR Take 20 mg by mouth daily.   blood glucose meter kit and supplies Kit Dispense based on patient and insurance preference. Use up to four times daily as directed.   cephALEXin 500 MG capsule Commonly known as: KEFLEX Take 1 capsule (500 mg total) by mouth every 6 (six) hours for 7 days.   cholecalciferol 25 MCG (1000 UNIT) tablet Commonly known as: VITAMIN D3 Take 1,000 Units by mouth daily.   fluticasone 50 MCG/ACT nasal spray Commonly known as: FLONASE Place 1 spray into both nostrils daily as needed for allergies.   insulin aspart 100 UNIT/ML FlexPen Commonly known as: NOVOLOG Inject 8 Units into the skin 3 (three) times daily with meals.   insulin detemir 100  UNIT/ML FlexPen Commonly known as: LEVEMIR Inject 30 Units into the skin at bedtime.   l-methylfolate-B6-B12 3-35-2 MG Tabs tablet Commonly known as: METANX Take 1 tablet by mouth daily.   metFORMIN 500 MG tablet Commonly known as: GLUCOPHAGE Take 500 mg by mouth daily with breakfast.   multivitamin capsule Take 1 capsule by mouth daily.   Omega-3 1000 MG Caps Take 1 g by mouth daily.   omeprazole 40 MG capsule Commonly known as: PRILOSEC Take 40 mg by mouth daily.   Pen Needles 3/16" 31G X 5 MM Misc 1 each by Does not apply route 4 (four) times daily.   vitamin B-12 100 MCG tablet Commonly known as: CYANOCOBALAMIN Take 100 mcg by mouth daily.   vitamin C 250 MG tablet Commonly known as: ASCORBIC ACID Take 250 mg by mouth daily.        Follow-up Information     Wallene Dales, MD Follow up.   Specialty: Family Medicine Contact information: [address] [place] [address] [phone]         [facility] Follow up.   Specialty: Endocrinology Why: Referral  placed. Contact information: [address], [place] [address] [phone]               No Known Allergies    Procedures/Studies: DG Chest 1 View  Result Date: 08/07/2022 CLINICAL DATA:  Pneumonia EXAM: CHEST  1 VIEW COMPARISON:  08/29/2016 chest radiograph. FINDINGS: Stable cardiomediastinal silhouette with normal heart size. No pneumothorax. No pleural effusion. No pulmonary edema. Low lung volumes. Mild hazy right lung base opacity with faint air bronchograms, increased slightly. Minimal curvilinear left costophrenic angle atelectasis. IMPRESSION: 1. Low lung volumes. Mild hazy right lung base opacity with faint air bronchograms, increased slightly, which could represent mild pneumonia. Chest radiograph follow-up advised. 2. Minimal curvilinear left costophrenic angle atelectasis. Electronically Signed   By: Ilona Sorrel M.D.    On: 08/07/2022 08:15       Discharge Exam: Vitals:   08/07/22 1100 08/07/22 1444  BP: (!) 86/54 (!) 147/65  Pulse: 78 84  Resp: 16 15  Temp: 97.8 F (36.6 C)   SpO2: 97% 98%    General: Pt is alert, awake, not in acute distress Cardiovascular: RRR, S1/S2 +, no edema Respiratory: CTA bilaterally, no wheezing, no rhonchi, no respiratory distress, no conversational dyspnea  Abdominal: Soft, NT, ND, bowel sounds + Extremities: no edema, no cyanosis Psych: Normal mood and affect, stable judgement and insight     The results of significant diagnostics from this hospitalization (including imaging, microbiology, ancillary and laboratory) are listed below for reference.     Microbiology: Recent Results (from the past 240 hour(s))  MRSA Next Gen by PCR, Nasal     Status: None   Collection Time: 08/06/22 11:03 PM   Specimen: Nasal Mucosa; Nasal Swab  Result Value Ref Range Status   MRSA by PCR Next Gen NOT DETECTED NOT DETECTED Final    Comment: (NOTE) The GeneXpert MRSA Assay (FDA approved for NASAL specimens only), is one component of a comprehensive MRSA colonization surveillance program. It is not intended to diagnose MRSA infection nor to guide or monitor treatment for MRSA infections. Test performance is not FDA approved in patients less than 110 years old. Performed at [facility], [place] [address], [place], [place] [address]   SARS Coronavirus 2 by RT PCR (hospital order, performed in [facility] hospital lab) *cepheid single result test* Anterior Nasal Swab     Status: None   Collection Time: 08/07/22  4:04 AM   Specimen: Anterior Nasal Swab  Result Value Ref Range Status   SARS Coronavirus 2 by RT PCR NEGATIVE NEGATIVE Final    Comment: (NOTE) SARS-CoV-2 target nucleic acids are NOT DETECTED.  The SARS-CoV-2 RNA is generally detectable in upper and lower respiratory specimens during the acute phase of infection. The lowest concentration of  SARS-CoV-2 viral copies this assay can detect is 250 copies / mL. A negative result does not preclude SARS-CoV-2 infection and should not be used as the sole basis for treatment or other patient management decisions.  A negative result may occur with improper specimen collection / handling, submission of specimen other than nasopharyngeal swab, presence of viral mutation(s) within the areas targeted by this assay, and inadequate number of viral copies (<250 copies / mL). A negative result must be combined with clinical observations, patient history, and epidemiological information.  Fact Sheet for Patients:   https://www.patel.info/  Fact Sheet for Healthcare Providers: https://hall.com/  This test is not yet approved or  cleared by the [place] and  has been authorized for detection and/or diagnosis of SARS-CoV-2 by FDA under an Emergency Use Authorization (EUA).  This EUA will remain in effect (meaning this test can be used) for the duration of the COVID-19 declaration under Section 564(b)(1) of the Act, 21 U.S.C. section 360bbb-3(b)(1), unless the authorization is terminated or revoked sooner.  Performed at [facility], [place] [address], [place], [place] [address]      Labs: BNP (last 3 results) No results for input(s): "BNP" in the last 8760 hours. Basic Metabolic Panel: Recent Labs  Lab 08/06/22 1154 08/06/22 1526 08/06/22 1936 08/07/22 0201 08/07/22 0753  NA 133* 134* 137 139 139  K 4.3 4.0 3.6 3.2* 3.5  CL 97*  --  105 106 109  CO2 17*  --  20* 24 22  GLUCOSE 484*  --  158* 156* 136*  BUN 20  --  _0 CREATININE 1.23  --  0.95 1.32* 1.08  CALCIUM 9.7  --  8.8* 8.8* 8.8*  MG  --   --   --   --  1.9   Liver Function Tests: No results for input(s): "AST", "ALT", "ALKPHOS", "BILITOT", "PROT", "ALBUMIN" in the last 168 hours. No results for input(s): "LIPASE", "AMYLASE" in the last 168  hours. No results for input(s): "AMMONIA" in the last 168 hours. CBC: Recent Labs  Lab 08/06/22 1154 08/06/22 1526  WBC 7.9  --   HGB 16.2 16.0  HCT 47.6 47.0  MCV 88.3  --   PLT 261  --    Cardiac Enzymes: No results for input(s): "CKTOTAL", "CKMB", "CKMBINDEX", "TROPONINI" in the last 168 hours. BNP: Invalid input(s): "POCBNP" CBG: Recent Labs  Lab 08/07/22 0645 08/07/22 0746 08/07/22 0859 08/07/22 1004 08/07/22 1106  GLUCAP 129* 150* 157* 195* 222*   D-Dimer No results for input(s): "DDIMER" in the last 72 hours. Hgb A1c Recent Labs    08/07/22 0201  HGBA1C 11.8*   Lipid Profile No results for input(s): "CHOL", "HDL", "LDLCALC", "TRIG", "CHOLHDL", "LDLDIRECT" in the last 72 hours. Thyroid function studies No results for input(s): "TSH", "T4TOTAL", "T3FREE", "THYROIDAB" in the last 72 hours.  Invalid input(s): "FREET3" Anemia work up No results for input(s): "VITAMINB12", "FOLATE", "FERRITIN", "TIBC", "IRON", "RETICCTPCT" in the last 72 hours. Urinalysis    Component Value Date/Time   COLORURINE YELLOW 08/06/2022 1115   APPEARANCEUR CLEAR 08/06/2022 1115   LABSPEC 1.020 08/06/2022 1115   PHURINE 5.0 08/06/2022 1115   GLUCOSEU >=500 (A) 08/06/2022 1115   HGBUR TRACE (A) 08/06/2022 1115   BILIRUBINUR SMALL (A) 08/06/2022 1115   KETONESUR >=80 (A) 08/06/2022 1115   PROTEINUR 30 (A) 08/06/2022 1115   NITRITE NEGATIVE 08/06/2022 1115   LEUKOCYTESUR NEGATIVE 08/06/2022 1115   Sepsis Labs Recent Labs  Lab 08/06/22 1154  WBC 7.9   Microbiology Recent Results (from the past 240 hour(s))  MRSA Next Gen by PCR, Nasal     Status: None   Collection Time: 08/06/22 11:03 PM   Specimen: Nasal Mucosa; Nasal Swab  Result Value Ref Range Status   MRSA by PCR Next Gen NOT DETECTED NOT DETECTED Final    Comment: (NOTE) The GeneXpert MRSA Assay (FDA approved for NASAL specimens only), is one component of a comprehensive MRSA colonization surveillance program. It  is not intended to diagnose MRSA infection nor to guide or monitor treatment for MRSA infections. Test performance is not FDA approved in patients less than 85 years old. Performed at [facility], [address] [place], [place] [address]   SARS Coronavirus 2 by RT PCR (hospital order, performed in [facility] hospital lab) *cepheid single result test* Anterior Nasal Swab     Status: None   Collection Time: 08/07/22  4:04 AM   Specimen: Anterior Nasal Swab  Result Value Ref Range Status   SARS Coronavirus 2 by RT PCR NEGATIVE NEGATIVE Final    Comment: (NOTE) SARS-CoV-2 target nucleic acids are NOT DETECTED.  The SARS-CoV-2 RNA is generally detectable in upper and lower respiratory specimens during the acute phase of infection. The lowest concentration of SARS-CoV-2 viral copies this assay can detect is 250 copies / mL. A negative result does not preclude SARS-CoV-2 infection and should not be used as the sole basis for treatment or other patient management decisions.  A negative result may occur with improper specimen collection / handling, submission of specimen other than nasopharyngeal swab, presence of viral mutation(s) within the areas targeted by this assay, and inadequate number of viral copies (<250 copies / mL). A negative result must be combined with clinical observations, patient history, and epidemiological information.  Fact Sheet for Patients:   https://www.patel.info/  Fact Sheet for Healthcare Providers: https://hall.com/  This test is not yet approved or  cleared by the [place] FDA and has been authorized for detection and/or diagnosis of SARS-CoV-2 by FDA under an Emergency Use Authorization (EUA).  This EUA will remain in effect (meaning this test can be used) for the duration of the COVID-19 declaration under Section 564(b)(1) of the Act, 21 U.S.C. section 360bbb-3(b)(1), unless the  authorization is terminated or revoked sooner.  Performed at [facility], [place] [address] [place], [place] [address]      Patient was seen and examined on the day of discharge and was found to be in stable condition. Time coordinating discharge: 45 minutes including assessment and coordination of care, as well as examination of the patient.   SIGNED:  Dessa Phi, DO [facility] 08/07/2022, 3:30 PM

## 2022-09-10 ENCOUNTER — Encounter: Payer: Self-pay | Admitting: Medical

## 2022-09-10 ENCOUNTER — Ambulatory Visit: Payer: 59 | Admitting: Medical

## 2022-09-10 VITALS — BP 150/80 | HR 76 | Resp 18 | Ht 69.0 in | Wt 260.4 lb

## 2022-09-10 DIAGNOSIS — R21 Rash and other nonspecific skin eruption: Secondary | ICD-10-CM

## 2022-09-10 DIAGNOSIS — Z23 Encounter for immunization: Secondary | ICD-10-CM | POA: Diagnosis not present

## 2022-09-10 DIAGNOSIS — I10 Essential (primary) hypertension: Secondary | ICD-10-CM | POA: Diagnosis not present

## 2022-09-10 DIAGNOSIS — E782 Mixed hyperlipidemia: Secondary | ICD-10-CM

## 2022-09-10 DIAGNOSIS — E1110 Type 2 diabetes mellitus with ketoacidosis without coma: Secondary | ICD-10-CM | POA: Diagnosis not present

## 2022-09-10 DIAGNOSIS — G4733 Obstructive sleep apnea (adult) (pediatric): Secondary | ICD-10-CM

## 2022-09-10 DIAGNOSIS — K219 Gastro-esophageal reflux disease without esophagitis: Secondary | ICD-10-CM | POA: Diagnosis not present

## 2022-09-10 DIAGNOSIS — E1169 Type 2 diabetes mellitus with other specified complication: Secondary | ICD-10-CM

## 2022-09-10 DIAGNOSIS — L089 Local infection of the skin and subcutaneous tissue, unspecified: Secondary | ICD-10-CM

## 2022-09-10 DIAGNOSIS — Z9989 Dependence on other enabling machines and devices: Secondary | ICD-10-CM

## 2022-09-10 LAB — CBC WITH DIFFERENTIAL/PLATELET
Basophils Absolute: 0 10*3/uL (ref 0.0–0.1)
Basophils Relative: 0.5 % (ref 0.0–3.0)
Eosinophils Absolute: 0.1 10*3/uL (ref 0.0–0.7)
Eosinophils Relative: 2.1 % (ref 0.0–5.0)
HCT: 41.7 % (ref 39.0–52.0)
Hemoglobin: 13.7 g/dL (ref 13.0–17.0)
Lymphocytes Relative: 22.1 % (ref 12.0–46.0)
Lymphs Abs: 1.5 10*3/uL (ref 0.7–4.0)
MCHC: 32.8 g/dL (ref 30.0–36.0)
MCV: 92.5 fl (ref 78.0–100.0)
Monocytes Absolute: 0.5 10*3/uL (ref 0.1–1.0)
Monocytes Relative: 7.8 % (ref 3.0–12.0)
Neutro Abs: 4.7 10*3/uL (ref 1.4–7.7)
Neutrophils Relative %: 67.5 % (ref 43.0–77.0)
Platelets: 262 10*3/uL (ref 150.0–400.0)
RBC: 4.5 Mil/uL (ref 4.22–5.81)
RDW: 14.2 % (ref 11.5–15.5)
WBC: 6.9 10*3/uL (ref 4.0–10.5)

## 2022-09-10 LAB — COMPREHENSIVE METABOLIC PANEL
ALT: 22 U/L (ref 0–53)
AST: 18 U/L (ref 0–37)
Albumin: 4 g/dL (ref 3.5–5.2)
Alkaline Phosphatase: 106 U/L (ref 39–117)
BUN: 12 mg/dL (ref 6–23)
CO2: 27 mEq/L (ref 19–32)
Calcium: 9.4 mg/dL (ref 8.4–10.5)
Chloride: 106 mEq/L (ref 96–112)
Creatinine, Ser: 0.71 mg/dL (ref 0.40–1.50)
GFR: 98.37 mL/min (ref >60.00)
Glucose, Bld: 139 mg/dL — ABNORMAL HIGH (ref 70–99)
Potassium: 4.2 mEq/L (ref 3.5–5.1)
Sodium: 143 mEq/L (ref 135–145)
Total Bilirubin: 0.5 mg/dL (ref 0.2–1.2)
Total Protein: 6.5 g/dL (ref 6.0–8.3)

## 2022-09-10 MED ORDER — LOSARTAN POTASSIUM 25 MG PO TABS: 25.0000 mg | ORAL_TABLET | Freq: Every day | ORAL | 0 refills | Status: DC

## 2022-09-10 NOTE — Progress Notes (Signed)
Subjective:    Patient ID: Jerry Griffith, male    DOB: 09-Oct-1961, 61 y.o.   MRN: [id number]  HPI Pt in for first time.   Pt works for [employer]), no regular exercise due to excess work. Drives a lot due to work.  Overall states healthy diet. He states tried to follow low sugar diet. 1 cigar once a month. Light smoker. Never smoked cigarettess for any length of time. Rare wine.   Pt  has diabetes. Hx of bariatric surgery. Recent hospitalization for very high surgery.  Pt states he was off insulin since 2018. Sugar got controlled after bariatic surgery. Before this sugar weighted 350 lb. His lowest wt post surgery 250 lbs.      Hospital note below.   "Admit date: 08/06/2022 Discharge date: 08/07/2022   Admitted From: Home Disposition:  Home    Recommendations for Outpatient Follow-up:  Follow up with PCP in 1 week. Need blood sugar follow up.  Referral to endocrinology and diabetes education placed.    Brief/Interim Summary: From H&P by Dr. Cyd Silence: "61 year old male with past medical history of gastric bypass surgery, hyperlipidemia, non-insulin-dependent diabetes mellitus type 2, gastroesophageal reflux disease, hypertension presenting to [place] emergency department with a several week history of progressively worsening generalized malaise weakness and polydipsia.   Patient explains that for approximately the past month he has been experiencing generalized malaise and weakness.  This was initially mild in intensity but progressively has become more and more severe.  Over the same span of time patient has experienced associated polydipsia and polyuria as well as gradual unintentional weight loss.  Patient denies fevers, shortness of breath, chest pain, abdominal pain, dysuria, sick contacts, recent travel or contact with confirmed COVID-19 infection.   Patient also explains that approximately 2 months ago he had a large cyst over his mid  back surgically incised.  While the wound has gradually improved the patient continues to experience some discomfort and occasional drainage from the wound in his back.  He explains that the other day his wife expressed a large amount of pus from the wound.  Patient denies any fever.   Due to patient's progressively worsening polydipsia polyuria and weakness he presented to see his primary care provider the first week of August and was noted to be markedly hyperglycemic with blood sugar in excess of 400.  His PCP placed him on metformin.  Despite this, in the weeks that followed patient's blood sugars continue to remain high and his symptoms continue to worsen.  Patient eventually presented to [place] emergency department for evaluation.   Upon evaluation in the emergency department patient was clinically found to be in diabetic ketoacidosis with an elevated anion gap, elevated beta hydroxybutyrate and markedly elevated blood sugars.  Patient was placed on an insulin infusion as well as intravenous fluids and the hospitalist group was called who accepted the patient for transfer to [facility] long hospital for continued medical care."   Subjective on day of discharge: Patient was transitioned from insulin drip to subcutaneous insulin once anion gap closed. Diabetic coordinator was consulted. He was given course of keflex for cellulitis in area of cyst excision. His blood sugar was improved and he was discharged home in good condition.   Discharge Diagnoses:    Principal Problem:   Uncontrolled type 2 diabetes mellitus with ketoacidosis without coma, without long-term current use of insulin (HCC) Active Problems:   Cellulitis of mid back region  Essential hypertension   Mixed diabetic hyperlipidemia associated with type 2 diabetes mellitus ([place])   GERD without esophagitis   OSA on CPAP    You were cared for by a hospitalist during your hospital stay. If you have any questions about your  discharge medications or the care you received while you were in the hospital after you are discharged, you can call the unit and ask to speak with the hospitalist on call if the hospitalist that took care of you is not available. Once you are discharged, your primary care physician will handle any further medical issues. Please note that NO REFILLS for any discharge medications will be authorized once you are discharged, as it is imperative that you return to your primary care physician (or establish a relationship with a primary care physician if you do not have one) for your aftercare needs so that they can reassess your need for medications and monitor your lab values.    Increase activity slowly   Complete by: As directed  "   Diabetes- on lemir 30 units in am per pt. He is on novolog 8 units with measl.  Pt has appointment with diabetic education but has not got scheduled with endocrinologist yet. Does not have appointment.    Endocrinology Why: Referral placed. Contact information: [address] [place] [address] [phone]  Sugars at home have been 120-180 range. He thinks average would be 150.        Discharge Instructions    Review of Systems  Constitutional:  Negative for chills, fatigue and fever.  HENT:  Negative for congestion, drooling, ear discharge and ear pain.   Respiratory:  Negative for cough, chest tightness and wheezing.   Cardiovascular:  Negative for chest pain and palpitations.  Gastrointestinal:  Negative for abdominal pain, blood in stool and diarrhea.  Genitourinary:  Negative for dysuria, frequency and hematuria.  Musculoskeletal:  Negative for back pain, myalgias and neck stiffness.  Skin:  Negative for rash.  Neurological:  Negative for dizziness, seizures, light-headedness and numbness.  Hematological:  Negative for adenopathy. Does not bruise/bleed easily.  Psychiatric/Behavioral:  Negative for behavioral  problems, decreased concentration and dysphoric mood.            Objective:   Physical Exam   General Mental Status- Alert. General Appearance- Not in acute distress.   Skin General: Color- Normal Color. Moisture- Normal Moisture.  Neck Carotid Arteries- Normal color. Moisture- Normal Moisture. No carotid bruits. No JVD.  Chest and Lung Exam Auscultation: Breath Sounds:-Normal.  Cardiovascular Auscultation:Rythm- Regular. Murmurs & Other Heart Sounds:Auscultation of the heart reveals- No Murmurs.  Abdomen Inspection:-Inspeection Normal. Palpation/Percussion:Note:No mass. Palpation and Percussion of the abdomen reveal- Non Tender, Non Distended + BS, no rebound or guarding.   Neurologic Cranial Nerve exam:- CN III-XII intact(No nystagmus), symmetric smile. Strength:- 5/5 equal and symmetric strength both upper and lower extremities.    Skin- dark hyperpigmented area to both axillary areas. Mildline scar in thoracic area where prior infection was.    Assessment & Plan:   Patient Instructions  Diabetes with reasonable control now with both levimir daiy and meal time novolog. Please call endocrinologist office again to get scheduled and attend diabetic education.  Htn- bp high today. Will prescribe low dose losartan 25 mg daily. May need to increase as eventually will want bp close to 130/80.  Foe gerd continue omeprazole.   Skin infection now resolved. Appear I and D resolved infection by exam. As post  hospital follow up did get cbc today.  For rash to axillary areas placed referral to dermatologist.  For sleep apnea continue cpap and follow up with pulmonologist.  Follow up in one month or sooner if needed.    Mackie Pai, PA-C

## 2022-09-10 NOTE — Patient Instructions (Addendum)
Diabetes with reasonable control now with both levimir daiy and meal time novolog. Please call endocrinologist office again to get scheduled and attend diabetic education.  Htn- bp high today. Will prescribe low dose losartan 25 mg daily. May need to increase as eventually will want bp close to 130/80.  Foe gerd continue omeprazole.   Skin infection now resolved. Appear I and D resolved infection by exam. As post hospital follow up did get cbc today.  For rash to axillary areas placed referral to dermatologist.  For sleep apnea continue cpap and follow up with pulmonologist.  Follow up in one month or sooner if needed.

## 2022-09-20 ENCOUNTER — Encounter: Payer: Self-pay | Admitting: Medical

## 2022-09-21 MED ORDER — INSULIN DETEMIR 100 UNIT/ML FLEXPEN: 30.0000 [IU] | PEN_INJECTOR | Freq: Every day | SUBCUTANEOUS | 0 refills | Status: DC

## 2022-09-21 MED ORDER — INSULIN ASPART 100 UNIT/ML FLEXPEN: 8.0000 [IU] | PEN_INJECTOR | Freq: Three times a day (TID) | SUBCUTANEOUS | 0 refills | Status: DC

## 2022-09-21 NOTE — Addendum Note (Signed)
Addended by: Anabel Halon on: 09/21/2022 08:01 AM   Modules accepted: Orders

## 2022-09-21 NOTE — Addendum Note (Signed)
Addended by: Anabel Halon on: 09/21/2022 08:03 AM   Modules accepted: Orders

## 2022-10-25 ENCOUNTER — Other Ambulatory Visit: Payer: Self-pay | Admitting: Medical

## 2022-11-05 ENCOUNTER — Ambulatory Visit (INDEPENDENT_AMBULATORY_CARE_PROVIDER_SITE_OTHER): Payer: 59 | Admitting: Medical

## 2022-11-05 ENCOUNTER — Encounter: Payer: Self-pay | Admitting: Medical

## 2022-11-05 VITALS — BP 133/75 | HR 80 | Temp 98.0°F | Resp 18 | Ht 69.0 in | Wt 266.0 lb

## 2022-11-05 DIAGNOSIS — M25561 Pain in right knee: Secondary | ICD-10-CM

## 2022-11-05 DIAGNOSIS — M25512 Pain in left shoulder: Secondary | ICD-10-CM | POA: Diagnosis not present

## 2022-11-05 DIAGNOSIS — I10 Essential (primary) hypertension: Secondary | ICD-10-CM | POA: Diagnosis not present

## 2022-11-05 DIAGNOSIS — E119 Type 2 diabetes mellitus without complications: Secondary | ICD-10-CM

## 2022-11-05 MED ORDER — MUPIROCIN 2 % EX OINT: 1.0000 | TOPICAL_OINTMENT | Freq: Two times a day (BID) | CUTANEOUS | 0 refills | Status: AC

## 2022-11-05 NOTE — Patient Instructions (Addendum)
For left shoulder pain(pain on palpation and rom) and rt knee pain decided to go ahead and refer you to sport med MD. If you can be seen this week than may not need xray. Sports med could give opinion. Sometime he will utilize [place]. Tylenol 500 mg every 6-8 hours. Can add voltaren gel twice daily.  Diabetes- continue levimir 30 units at night. Future A1c to be done next week  Htn- continue losartan 25 mg daily.  Rt lower ext healing area. Some hyperpigmented. Some scabs. Rx mupirocin.  Follow up date to be determined after labs and sport med note review.

## 2022-11-05 NOTE — Progress Notes (Signed)
Subjective:    Patient ID: Jerry Griffith, male    DOB: Jan 10, 1961, 61 y.o.   MRN: [id number]  HPI  Pt in for for follow up.  Pt has some small areas of rash/bumps rt lower extremity that occurred initially in August. They resolved then 2 weeks ago came back. Area itch but no pain. No DC. No fever. No chills.    Rt knee- has been hurting for 3 weeks. States hurts when walks. 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 Worse when on knees.   Htn- on losartan 25 mg daily. When he checks about the same  Left anterior shoulder pain- no pain at rest. But when he abducts left arm pain hurts but goes away when he get arm above shoulder level. No associated cardiac symptoms. No pain at rest.   Past regimen  Diabetes- on lemir 30 units in am per pt.  He is on novolog 8 units with measl.  Pt states average 130-140. Lowest has been 90 he stopped meal time insulin 3 weeks ago.      Review of Systems  Constitutional:  Negative for chills, fatigue and fever.  HENT:  Negative for congestion and ear discharge.   Respiratory:  Negative for cough, chest tightness, shortness of breath and wheezing.   Cardiovascular:  Negative for chest pain and palpitations.  Gastrointestinal:  Negative for abdominal pain.  Genitourinary:  Negative for dysuria.  Musculoskeletal:  Negative for back pain and neck pain.       See hpi.  Skin:  Negative for pallor and wound.  Neurological:  Negative for dizziness, seizures, weakness and headaches.  Hematological:  Negative for adenopathy. Does not bruise/bleed easily.  Psychiatric/Behavioral:  Negative for behavioral problems, confusion, dysphoric mood and suicidal ideas. The patient is not nervous/anxious.    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   Sinus  congestion    Sleep apnea    cpap     Social History   Socioeconomic History   Marital status: Married    Spouse name: Not on file   Number of children: Not on file   Years of education: Not on file   Highest education level: Not on file  Occupational History   Not on file  Tobacco Use   Smoking status: Some Days    Types: Cigars   Smokeless tobacco: Never   Tobacco comments:    occ cigar  Substance and Sexual Activity   Alcohol use: No    Alcohol/week: 0.0 standard drinks of alcohol   Drug use: No   Sexual activity: Not on file  Other Topics Concern   Not on file  Social History Narrative   Not on file   Social Determinants of Health   Financial Resource Strain: Not on file  Food Insecurity: Not on file  Transportation Needs: Not on file  Physical Activity: Not on file  Stress: Not on file  Social Connections: Not on file  Intimate Partner Violence: Not on file    Past Surgical History:  Procedure Laterality Date   CARPAL TUNNEL RELEASE Right 04/05/2016   Procedure: RIGHT CARPAL TUNNEL RELEASE;  Surgeon: Leanora Cover, MD;  Location: [facility];  Service: Orthopedics;  Laterality: Right;   PROSTATE SURGERY  2/14  TONSILLECTOMY      Family History  Problem Relation Age of Onset   Heart disease Neg Hx     No Known Allergies  Current Outpatient Medications on File Prior to Visit  Medication Sig Dispense Refill   atorvastatin (LIPITOR) 20 MG tablet Take 20 mg by mouth daily.     blood glucose meter kit and supplies KIT Dispense based on patient and insurance preference. Use up to four times daily as directed. 1 each 0   cholecalciferol (VITAMIN D3) 25 MCG (1000 UNIT) tablet Take 1,000 Units by mouth daily.     fluticasone (FLONASE) 50 MCG/ACT nasal spray Place 1 spray into both nostrils daily as needed for allergies.     insulin aspart (NOVOLOG) 100 UNIT/ML FlexPen Inject 8 Units into the skin 3 (three) times daily with meals. 15 mL 0   insulin detemir (LEVEMIR)  100 UNIT/ML FlexPen Inject 30 Units into the skin at bedtime. 15 mL 0   Insulin Pen Needle (PEN NEEDLES 3/16") 31G X 5 MM MISC 1 each by Does not apply route 4 (four) times daily. 100 each 1   l-methylfolate-B6-B12 (METANX) 3-35-2 MG TABS tablet Take 1 tablet by mouth daily.     losartan (COZAAR) 25 MG tablet TAKE ONE TABLET BY MOUTH ONE TIME DAILY 30 tablet 0   metFORMIN (GLUCOPHAGE) 500 MG tablet Take 500 mg by mouth daily with breakfast.      Multiple Vitamin (MULTIVITAMIN) capsule Take 1 capsule by mouth daily.     Omega-3 1000 MG CAPS Take 1 g by mouth daily.     omeprazole (PRILOSEC) 40 MG capsule Take 40 mg by mouth daily.     vitamin B-12 (CYANOCOBALAMIN) 100 MCG tablet Take 100 mcg by mouth daily.     vitamin C (ASCORBIC ACID) 250 MG tablet Take 250 mg by mouth daily.     No current facility-administered medications on file prior to visit.    BP 133/75   Pulse 80   Temp 98 F (36.7 C)   Resp 18   Ht _0  (1.753 m)   Wt 266 lb (120.7 kg)   SpO2 98%   BMI 39.28 kg/m         Objective:   Physical Exam  General- No acute distress. Pleasant patient. Neck- Full range of motion, no jvd Lungs- Clear, even and unlabored. Heart- regular rate and rhythm. Neurologic- CNII- XII grossly intact.  Rt knee- pain on palpation anterior aspect on rom. Some crepitus. Left shulder- pain on rom obvious. Some crepitus. Pain on palpation anteiror aspect. Bicep tendon.  Rt lower ext- small hyperpigmented scabs in process of healing. Small number of about 6 numbered healing scabs some just hyperpigmented. No tendernes. Not warm      Assessment & Plan:   Patient Instructions  For left shoulder pain(pain on palpation and rom) and rt knee pain decided to go ahead and refer you to sport med MD. If you can be seen this week than may not need xray. Sports med could give opinion. Sometime he will utilize [place]. Tylenol 500 mg every 6-8 hours. Can add voltaren gel twice daily.  Diabetes- continue  levimir 30 units at night. Future A1c to be done next week  Htn- continue losartan 25 mg daily.  Rt lower ext healing area. Some hyperpigmented. Some scabs. Rx mupirocin.  Follow up date to be determined after labs and sport med note review.   Mackie Pai, PA-C

## 2022-11-12 ENCOUNTER — Other Ambulatory Visit (INDEPENDENT_AMBULATORY_CARE_PROVIDER_SITE_OTHER): Payer: 59

## 2022-11-12 ENCOUNTER — Other Ambulatory Visit (HOSPITAL_BASED_OUTPATIENT_CLINIC_OR_DEPARTMENT_OTHER): Payer: Self-pay

## 2022-11-12 DIAGNOSIS — E119 Type 2 diabetes mellitus without complications: Secondary | ICD-10-CM

## 2022-11-12 LAB — HEMOGLOBIN A1C: Hgb A1c MFr Bld: 7.1 % — ABNORMAL HIGH (ref 4.6–6.5)

## 2022-11-12 LAB — COMPREHENSIVE METABOLIC PANEL
ALT: 19 U/L (ref 0–53)
AST: 15 U/L (ref 0–37)
Albumin: 4.1 g/dL (ref 3.5–5.2)
Alkaline Phosphatase: 98 U/L (ref 39–117)
BUN: 10 mg/dL (ref 6–23)
CO2: 29 mEq/L (ref 19–32)
Calcium: 9 mg/dL (ref 8.4–10.5)
Chloride: 104 mEq/L (ref 96–112)
Creatinine, Ser: 0.72 mg/dL (ref 0.40–1.50)
GFR: 97.84 mL/min (ref >60.00)
Glucose, Bld: 135 mg/dL — ABNORMAL HIGH (ref 70–99)
Potassium: 4.1 mEq/L (ref 3.5–5.1)
Sodium: 141 mEq/L (ref 135–145)
Total Bilirubin: 0.4 mg/dL (ref 0.2–1.2)
Total Protein: 6.4 g/dL (ref 6.0–8.3)

## 2022-11-12 MED ORDER — COMIRNATY 30 MCG/0.3ML IM SUSY
PREFILLED_SYRINGE | INTRAMUSCULAR | 0 refills | Status: DC
  Filled 2022-11-12: qty 0.3, 1d supply, fill #0

## 2022-11-14 ENCOUNTER — Other Ambulatory Visit: Payer: Self-pay | Admitting: Medical

## 2022-11-19 ENCOUNTER — Ambulatory Visit: Payer: Self-pay

## 2022-11-19 ENCOUNTER — Ambulatory Visit (INDEPENDENT_AMBULATORY_CARE_PROVIDER_SITE_OTHER): Payer: 59 | Admitting: Family Medicine

## 2022-11-19 VITALS — BP 138/80 | Ht 69.0 in | Wt 255.0 lb

## 2022-11-19 DIAGNOSIS — M25411 Effusion, right shoulder: Secondary | ICD-10-CM | POA: Diagnosis not present

## 2022-11-19 DIAGNOSIS — M23203 Derangement of unspecified medial meniscus due to old tear or injury, right knee: Secondary | ICD-10-CM

## 2022-11-19 DIAGNOSIS — M25412 Effusion, left shoulder: Secondary | ICD-10-CM

## 2022-11-19 DIAGNOSIS — M25561 Pain in right knee: Secondary | ICD-10-CM

## 2022-11-19 NOTE — Patient Instructions (Signed)
Nice to meet you Please try ice on the knee  Please alternate heat and ice on the shoulder  I have made a referral to physical therapy You can use voltaren over the counter if needed  Please try the insoles. We can consider custom orthotics  Please send me a message in MyChart with any questions or updates.  Please see me back in 4-6 weeks.   --Dr. Raeford Razor

## 2022-11-19 NOTE — Assessment & Plan Note (Signed)
Acutely occurring. Changes appreciated in the medial compartment of the medial meniscus  - counseled on home exercise therapy and supportive care - referral to physical therapy  - green sport insoles  - could consider custom orthotics - could consider injection.

## 2022-11-19 NOTE — Progress Notes (Addendum)
  Jerry Griffith - 61 y.o. male MRN [id number]  Date of birth: [birth year]  SUBJECTIVE:  Including CC & ROS.  No chief complaint on file.   Jerry Griffith is a 61 y.o. male that is presenting with left shoulder pain and acute right knee pain.  The pain has been present for 2 to 3 months.  Seems to be worse with driving.  No specific injury.  Localized to each joint.    Review of Systems See HPI   HISTORY: Past Medical, Surgical, Social, and Family History Reviewed & Updated per EMR.   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   Sinus congestion    Sleep apnea    cpap    Past Surgical History:  Procedure Laterality Date   CARPAL TUNNEL RELEASE Right 04/05/2016   Procedure: RIGHT CARPAL TUNNEL RELEASE;  Surgeon: Leanora Cover, MD;  Location: [facility];  Service: Orthopedics;  Laterality: Right;   PROSTATE SURGERY  2/14   TONSILLECTOMY       PHYSICAL EXAM:  VS: BP 138/80   Ht '5\' 9"'$  (1.753 m)   Wt 255 lb (115.7 kg)   BMI 37.66 kg/m  Physical Exam Gen: NAD, alert, cooperative with exam, well-appearing MSK:  Neurovascularly intact    Limited ultrasound: Right knee, left shoulder:  Right knee:  Trace effusion  Normal quadricept and patellar tendon  Medial meniscus degenerative changes No changes in lateral meniscus   Left shoulder:  Mild effusion in the bicipital tendon sheath  Overlying effusion and ligament thickness of the subscapularis  Subacromial bursitis   Summary: degenerative meniscus tear of right knee and left shoulder effusion   Ultrasound and interpretation by Clearance Coots, MD    ASSESSMENT & PLAN:   Effusion of left shoulder Acutely occurring. Component of capsulitis and underlying changes of the labrum  - counseled on home exercise  therapy and supportive care - referral to physical therapy  - could consider injection.   Degenerative tear of medial meniscus of right knee Acutely occurring. Changes appreciated in the medial compartment of the medial meniscus  - counseled on home exercise therapy and supportive care - referral to physical therapy  - green sport insoles  - could consider custom orthotics - could consider injection.

## 2022-11-19 NOTE — Assessment & Plan Note (Signed)
Acutely occurring. Component of capsulitis and underlying changes of the labrum  - counseled on home exercise therapy and supportive care - referral to physical therapy  - could consider injection.

## 2022-11-26 ENCOUNTER — Encounter: Payer: Self-pay | Admitting: Medical

## 2022-11-26 DIAGNOSIS — E1110 Type 2 diabetes mellitus with ketoacidosis without coma: Secondary | ICD-10-CM

## 2022-11-26 DIAGNOSIS — E119 Type 2 diabetes mellitus without complications: Secondary | ICD-10-CM

## 2022-12-03 ENCOUNTER — Ambulatory Visit: Payer: 59 | Admitting: Physical Therapy

## 2022-12-06 ENCOUNTER — Other Ambulatory Visit: Payer: Self-pay | Admitting: Medical

## 2022-12-10 ENCOUNTER — Encounter: Payer: Self-pay | Admitting: Medical

## 2022-12-10 ENCOUNTER — Ambulatory Visit: Payer: 59 | Admitting: Medical

## 2022-12-10 VITALS — BP 140/80 | HR 82 | Temp 98.0°F | Resp 18 | Ht 69.0 in | Wt 267.0 lb

## 2022-12-10 DIAGNOSIS — I10 Essential (primary) hypertension: Secondary | ICD-10-CM

## 2022-12-10 MED ORDER — LOSARTAN POTASSIUM 50 MG PO TABS: 50.0000 mg | ORAL_TABLET | Freq: Every day | ORAL | 0 refills | Status: DC

## 2022-12-10 NOTE — Progress Notes (Signed)
Subjective:    Patient ID: Jerry Griffith, male    DOB: [birth year], 61 y.o.   MRN: [id number]  HPI  Martin Majestic to ED recently.  12-04-22  Chief Complaint  Patient presents with   Hypertension    History of Present Illness  History obtained from:Patient  Shahrukh Pasch. is a 61 y.o. male with a complaint of hypertension. Patient reports that he checked his blood pressure tonight because his wife was checking hers. He notes that it was elevated at 190/100 and he became concerned. He does report a history of hypertension and takes losartan. He states that he has been eating more unhealthy than usual recently. Patient denies fever, chills, sore throat, chest pain, shortness of breath, abdominal pain, nausea, vomiting, diarrhea, headache, blurred vision, or other medical complaints at this time.   BP 169/95  Pulse 72  Temp 97.7 F (36.5 C) (Oral)  Resp 19  Wt 113.4 kg (250 lb)  SpO2 96%  BMI 38.01 kg/m    Medical Decision Making Patient is a 61 year old male who presented to the ED for evaluation of hypertension. He reports checking his blood pressure tonight as his wife was checking hers. Patient notes that it was elevated at 190/100 and became concerned. He does report history of hypertension and takes losartan as prescribed. On exam, patient is afebrile, nontoxic-appearing, hypertensive at 169/95. Other vital signs stable. Patient denies any symptoms at present. No headache, chest pain, vision changes to suspect possible hypertensive emergency. I do believe this patient likely needs medication changes. I have recommended that he continue a log of blood pressure readings at home to take to his PCP in the next 1 to 2 weeks. Patient given strict ED return precautions. I do not believe further workup at this time is necessary. Patient ready for d/c. Patient discharged home in stable condition. Parent/patient agrees with treatment plan and disposition. All questions answered. Strict ED  return precautions dicussed with parent/patient. Reviewed discharge instructions in detail. Parent/patient verbalizes understanding and agrees with the plan and has no further questions or concerns. "  Pt states he was never symptomatic. No cardiac or neurologic symptoms. Just notice his bp was high when checked. Admits was eating a lot of high salt foods before bp levels were high.    Only one cup of coffee   Pt is on losartan 25 mg daily.   Review of Systems  Constitutional:  Negative for chills, fatigue and fever.  Respiratory:  Negative for cough, chest tightness, wheezing and stridor.   Cardiovascular:  Negative for chest pain and palpitations.  Gastrointestinal:  Negative for abdominal pain.  Genitourinary:  Negative for dysuria.  Musculoskeletal:  Negative for back pain and joint swelling.  Skin:  Negative for rash.  Neurological:  Negative for dizziness, speech difficulty, weakness, numbness and headaches.  Hematological:  Negative for adenopathy. Does not bruise/bleed easily.  Psychiatric/Behavioral:  Negative for behavioral problems and confusion.     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   Sinus congestion    Sleep apnea    cpap     Social History   Socioeconomic History   Marital status: Married    Spouse name: Not on file   Number of children: Not on file   Years  of education: Not on file   Highest education level: Not on file  Occupational History   Not on file  Tobacco Use   Smoking status: Some Days    Types: Cigars   Smokeless tobacco: Never   Tobacco comments:    occ cigar  Substance and Sexual Activity   Alcohol use: No    Alcohol/week: 0.0 standard drinks of alcohol   Drug use: No   Sexual activity: Not on file  Other Topics Concern   Not on file  Social History  Narrative   Not on file   Social Determinants of Health   Financial Resource Strain: Not on file  Food Insecurity: Not on file  Transportation Needs: Not on file  Physical Activity: Not on file  Stress: Not on file  Social Connections: Not on file  Intimate Partner Violence: Not on file    Past Surgical History:  Procedure Laterality Date   CARPAL TUNNEL RELEASE Right 04/05/2016   Procedure: RIGHT CARPAL TUNNEL RELEASE;  Surgeon: Leanora Cover, MD;  Location: [facility];  Service: Orthopedics;  Laterality: Right;   PROSTATE SURGERY  2/14   TONSILLECTOMY      Family History  Problem Relation Age of Onset   Heart disease Neg Hx     No Known Allergies  Current Outpatient Medications on File Prior to Visit  Medication Sig Dispense Refill   atorvastatin (LIPITOR) 20 MG tablet Take 20 mg by mouth daily.     blood glucose meter kit and supplies KIT Dispense based on patient and insurance preference. Use up to four times daily as directed. 1 each 0   cholecalciferol (VITAMIN D3) 25 MCG (1000 UNIT) tablet Take 1,000 Units by mouth daily.     COVID-19 mRNA vaccine 2023-2024 (COMIRNATY) syringe Inject into the muscle. 0.3 mL 0   fluticasone (FLONASE) 50 MCG/ACT nasal spray Place 1 spray into both nostrils daily as needed for allergies.     insulin aspart (NOVOLOG) 100 UNIT/ML FlexPen Inject 8 Units into the skin 3 (three) times daily with meals. 15 mL 0   insulin detemir (LEVEMIR FLEXPEN) 100 UNIT/ML FlexPen Inject 30 Units into the skin at bedtime. 15 mL 3   Insulin Pen Needle (PEN NEEDLES 3/16") 31G X 5 MM MISC 1 each by Does not apply route 4 (four) times daily. 100 each 1   l-methylfolate-B6-B12 (METANX) 3-35-2 MG TABS tablet Take 1 tablet by mouth daily.     losartan (COZAAR) 25 MG tablet Take 1 tablet (25 mg total) by mouth daily. 30 tablet 0   metFORMIN (GLUCOPHAGE) 500 MG tablet Take 500 mg by mouth daily with breakfast.      Multiple Vitamin (MULTIVITAMIN) capsule Take 1 capsule by  mouth daily.     mupirocin ointment (BACTROBAN) 2 % Apply 1 Application topically 2 (two) times daily. 22 g 0   Omega-3 1000 MG CAPS Take 1 g by mouth daily.     omeprazole (PRILOSEC) 40 MG capsule Take 40 mg by mouth daily.     vitamin B-12 (CYANOCOBALAMIN) 100 MCG tablet Take 100 mcg by mouth daily.     vitamin C (ASCORBIC ACID) 250 MG tablet Take 250 mg by mouth daily.     No current facility-administered medications on file prior to visit.    BP (!) 140/80   Pulse 82   Temp 98 F (36.7 C)   Resp 18   Ht _0  (1.753 m)   Wt 267 lb (121.1 kg)   SpO2  98%   BMI 39.43 kg/m        Objective:   Physical Exam  General Mental Status- Alert. General Appearance- Not in acute distress.   Skin General: Color- Normal Color. Moisture- Normal Moisture.  Neck Carotid Arteries- Normal color. Moisture- Normal Moisture. No carotid bruits. No JVD.  Chest and Lung Exam Auscultation: Breath Sounds:-Normal.  Cardiovascular Auscultation:Rythm- Regular. Murmurs & Other Heart Sounds:Auscultation of the heart reveals- No Murmurs.  Abdomen Inspection:-Inspeection Normal. Palpation/Percussion:Note:No mass. Palpation and Percussion of the abdomen reveal- Non Tender, Non Distended + BS, no rebound or guarding.   Neurologic Cranial Nerve exam:- CN III-XII intact(No nystagmus), symmetric smile. Drift Test:- No drift. Romberg Exam:- Negative.  Strength:- 5/5 equal and symmetric strength both upper and lower extremities.       Assessment & Plan:   Patient Instructions  Htn- bp is 140/80. Could be better but not high like the other day at home or in ED. Will increase you losartan to 50 mg daily.  Avoid high salt foods and not to take nsaids.  Follow up in 7 days nurse bp check or sooner if needed. Also please bring your bp machine in that day so we can compare your machine to our manual reading.   Mackie Pai, PA-C

## 2022-12-10 NOTE — Patient Instructions (Addendum)
Htn- bp is 140/80. Could be better but not high like the other day at home or in ED. Will increase you losartan to 50 mg daily.  Avoid high salt foods and not to take nsaids.  Follow up in 7 days nurse bp check or sooner if needed. Also please bring your bp machine in that day so we can compare your machine to our manual reading.

## 2022-12-21 ENCOUNTER — Ambulatory Visit (INDEPENDENT_AMBULATORY_CARE_PROVIDER_SITE_OTHER): Payer: 59

## 2022-12-21 DIAGNOSIS — I10 Essential (primary) hypertension: Secondary | ICD-10-CM

## 2022-12-21 NOTE — Progress Notes (Addendum)
Pt here for Blood pressure check per Percell Miller Saguier  Pt currently takes: Losartan '50mg'$    Pt reports compliance with medication.  BP today @ =142/88 left arm Right Arm 146/94 HR =92 166/95 left arm his machine  145/99 right arm his machine  Pt advised per Mackie Pai to take his Losartan 2 times daily for a total of '100mg'$  to get BP down to 130 range, PCP would also like Pt to return in one week for a follow up. Follow up scheduled for 12/28/2022   Mackie Pai, PA-C

## 2022-12-27 NOTE — Therapy (Signed)
OUTPATIENT PHYSICAL THERAPY EVALUATION   Patient Name: Heraclio Seidman MRN: [id number] DOB:[birth year], 62 y.o., male Today's Date: 12/31/2022  END OF SESSION:  PT End of Session - 12/31/22 0838     Visit Number 1    Date for PT Re-Evaluation 02/25/23    Authorization Type [employer] - VL: [id number]    PT Start Time 0839    PT Stop Time 0930    PT Time Calculation (min) 51 min    Activity Tolerance Patient tolerated treatment well    Behavior During Therapy [facility] for tasks assessed/performed             Past Medical History:  Diagnosis Date   Arthritis    Cancer ([place]) 2/14   prostate   Diabetes mellitus without complication ([place])    Headache    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   Sinus congestion    Sleep apnea    cpap   Past Surgical History:  Procedure Laterality Date   CARPAL TUNNEL RELEASE Right 04/05/2016   Procedure: RIGHT CARPAL TUNNEL RELEASE;  Surgeon: Leanora Cover, MD;  Location: [facility];  Service: Orthopedics;  Laterality: Right;   PROSTATE SURGERY  2/14   TONSILLECTOMY     Patient Active Problem List   Diagnosis Date Noted   Degenerative tear of medial meniscus of right knee 11/19/2022   Effusion of left shoulder 11/19/2022   GERD without esophagitis 08/07/2022   Mixed diabetic hyperlipidemia associated with type 2 diabetes mellitus ([place]) 08/07/2022   Essential hypertension 08/07/2022   Cellulitis of mid back region 08/07/2022   OSA on CPAP 08/07/2022   Uncontrolled type 2 diabetes mellitus with ketoacidosis without coma, without long-term current use of insulin ([place]) 08/06/2022   Malignant neoplasm of prostate ([place]) 08/20/2014    PCP: Mackie Pai, PA-C   REFERRING PROVIDER: Rosemarie Ax, MD   REFERRING DIAG:  [phone] (ICD-10-CM) - Degenerative tear of medial meniscus of right knee  M25.411 (ICD-10-CM) - Shoulder effusion, right   THERAPY DIAG:  Acute pain of left shoulder  Stiffness of left shoulder, not elsewhere  classified  Abnormal posture  Muscle weakness (generalized)  Acute pain of right knee  RATIONALE FOR EVALUATION AND TREATMENT: Rehabilitation  ONSET DATE: ~3 months  NEXT MD VISIT: 12/31/22   SUBJECTIVE:   SUBJECTIVE STATEMENT: Pt reports he is unsure how he hurt his R knee but states it has improved since wearing the elastic brace. He has difficulty raising his L shoulder as well as lifting things with his L arm. L shoulder pain interferes with job tasks at times but can usually work around the pain at home, although it does interfere with bathing.  PAIN: Are you having pain? Yes: NPRS scale: 0/10 at rest, with movement up to 8/10 Pain location: L anterolateral shoulder Pain description: sharp, wince Aggravating factors: raising L arm overhead or lifting with L arm Relieving factors: rubbing, heating pad  PERTINENT HISTORY: DM, HTN, OA, prostate cancer, OSA - CPAP, GERD, R CTR, recent surgery to remove cyst from back with post-op infection  PRECAUTIONS: None  WEIGHT BEARING RESTRICTIONS: No  FALLS:  Has patient fallen in last 6 months? 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 Make sure I'm stable and my knees don't give out."   OBJECTIVE:   DIAGNOSTIC FINDINGS:  11/19/22 - Limited ultrasound: Right knee, left shoulder:   Right knee:  Trace effusion  Normal quadricept and patellar tendon  Medial meniscus degenerative changes No changes in lateral meniscus    Left shoulder:  Mild effusion in the bicipital tendon sheath  Overlying  effusion and ligament thickness of the subscapularis  Subacromial bursitis    Summary: degenerative meniscus tear of right knee and left shoulder effusion   PATIENT SURVEYS:  LEFS 70 / 80 = 87.5 % Quick Dash 25.0 / 100 = 25.0 %  COGNITION: Overall cognitive status: Within functional limits for tasks assessed     SENSATION: WFL  EDEMA:  R knee previously swelling, but no resolved  MUSCLE LENGTH: Hamstrings: mild tight R>L ITB: mod tight R>L Piriformis: WFL Hip flexors: mild tight R>L Quads: mild/mod tight R>L  POSTURE:  rounded shoulders and forward head  PALPATION: Mildly TTP in L pecs, anterior deltoid. Mildly restricted R patellar mobility.  UPPER EXTREMITY ROM:   Active ROM Right eval Left Eval   Shoulder flexion 150 143 *  Shoulder extension 44 39  Shoulder abduction 161 150 *  Shoulder adduction    Shoulder internal rotation FIR T10 FIR T10 *  Shoulder external rotation FER T2 FER T2 *  Elbow flexion    Elbow extension    Wrist flexion    Wrist extension    Wrist ulnar deviation    Wrist radial deviation    Wrist pronation    Wrist supination    (Blank rows = not tested) * = pain  UPPER EXTREMITY MMT:  MMT Right eval Left eval  Shoulder flexion 5 4- *  Shoulder extension 5 4+  Shoulder abduction 5 4- *  Shoulder adduction    Shoulder internal rotation 5 4 *  Shoulder external rotation 5 4  Middle trapezius    Lower trapezius    Elbow flexion    Elbow extension    Wrist flexion    Wrist extension    Wrist ulnar deviation    Wrist radial deviation    Wrist pronation    Wrist supination    Grip strength (lbs)    (Blank rows = not tested) * = pain  SHOULDER SPECIAL TESTS: Impingement tests: Neer impingement test: positive  and Hawkins/Kennedy impingement test: positive  on L shoulder  LOWER EXTREMITY ROM:  Active ROM Right eval Left eval  Hip flexion    Hip extension    Hip abduction    Hip adduction    Hip internal rotation     Hip external rotation    Knee flexion 131 141  Knee extension 0 0  Ankle dorsiflexion    Ankle plantarflexion    Ankle inversion    Ankle eversion     (Blank rows = not tested)  LOWER EXTREMITY MMT:  MMT Right eval Left eval  Hip flexion 4+ 4+  Hip extension 4 4  Hip abduction 4 4+  Hip adduction 4+ 4+  Hip internal rotation 5 5  Hip external rotation 4+ 4+  Knee flexion 5 5  Knee extension 5 5  Ankle dorsiflexion 5 5  Ankle plantarflexion    Ankle inversion    Ankle eversion     (  Blank rows = not tested)  LOWER EXTREMITY SPECIAL TESTS:  Hip special tests: Ober's test: positive  Knee special tests: McMurray's test: negative   TODAY'S TREATMENT:  12/31/22 Eval only   PATIENT EDUCATION:  Education details: PT eval findings and anticipated POC Person educated: Patient Education method: Explanation Education comprehension: verbalized understanding  HOME EXERCISE PROGRAM: TBD  ASSESSMENT:  CLINICAL IMPRESSION: Abu Heavin is a 61 y.o. male who was seen today for physical therapy evaluation and treatment for R knee pain secondary to medial meniscus tear and L shoulder pain secondary to effusion.  He reports the knee pain is mostly resolved but continues to have ongoing issues related to the left shoulder, with primary limitations noted with reaching overhead or lifting heavy objects which impacts his ability to complete his job tasks.  Current shoulder deficits include limited and painful L shoulder ROM, decreased left shoulder strength, and postural abnormalities with increased muscle tension and tenderness to palpation in left anterolateral shoulder.  Deficits also noted R knee flexion ROM and proximal lower extremity flexibility, as well as proximal lower extremity strength, R>L. Kenyon "Liliane Channel" will benefit from skilled PT to address above deficits to improve mobility and activity tolerance with decreased pain interference.   OBJECTIVE IMPAIRMENTS: decreased  activity tolerance, decreased knowledge of condition, decreased mobility, difficulty walking, decreased ROM, decreased strength, increased fascial restrictions, impaired perceived functional ability, increased muscle spasms, impaired flexibility, impaired UE functional use, improper body mechanics, postural dysfunction, and pain.   ACTIVITY LIMITATIONS: carrying, lifting, bending, standing, squatting, stairs, transfers, bathing, dressing, reach over head, locomotion level, and caring for others  PARTICIPATION LIMITATIONS: meal prep, cleaning, laundry, driving, shopping, community activity, occupation, and yard work  PERSONAL FACTORS: Fitness, Past/current experiences, Profession, Time since onset of injury/illness/exacerbation, and 3+ comorbidities: DM, HTN, OA, prostate cancer, OSA - CPAP, GERD, R CTR, recent surgery to remove cyst from back with post-op infection  are also affecting patient's functional outcome.   REHAB POTENTIAL: Excellent  CLINICAL DECISION MAKING: Stable/uncomplicated  EVALUATION COMPLEXITY: Low   GOALS: Goals reviewed with patient? Yes  SHORT TERM GOALS: Target date: 01/28/2023  Patient will be independent with initial HEP. Baseline:  Goal status: INITIAL  LONG TERM GOALS: Target date: 02/25/2023  Patient will be independent with advanced/ongoing HEP to improve outcomes and carryover.  Baseline:  Goal status: INITIAL  2.  Patient will report at least 75% improvement in L shoulder pain to improve QOL. Baseline:  Goal status: INITIAL  3.  Patient to improve L shoulder AROM to WFL/WNL without pain provocation. Baseline:  Goal status: INITIAL  4.  Patient will demonstrate improved L shoulder strength to >/= 4+/5 for functional UE use. Baseline:  Goal status: INITIAL  5.  Patient will demonstrate improved B proximal LE strength to >/= 4+/5 for improved stability and ease of mobility. Baseline:  Goal status: INITIAL  6.  Patient to report ability to perform  ADLs, household, and work-related tasks without limitation due to L shoulder pain, LOM or weakness.  Baseline:  Goal status: INITIAL  7. Patient will be able to squat without limitation due to R knee pain or LOM to allow him to complete his job-related tasks.  Baseline:  Goal status: INITIAL  8.  Patient will report </= 15% on QuickDASH to demonstrate improved functional ability. Baseline: 25.0 / 100 = 25.0% Goal status: INITIAL   PLAN:  PT FREQUENCY: 1-2x/week  PT DURATION: 8 weeks  PLANNED INTERVENTIONS: Therapeutic exercises, Therapeutic activity, Neuromuscular re-education, Balance training, Gait  training, Patient/Family education, Self Care, Joint mobilization, Stair training, DME instructions, Aquatic Therapy, Dry Needling, Electrical stimulation, Cryotherapy, Moist heat, Taping, Vasopneumatic device, Ultrasound, Ionotophoresis '4mg'$ /ml Dexamethasone, Manual therapy, and Re-evaluation  PLAN FOR NEXT SESSION: Create initial HEP; postural awareness training with anterior stretching and scapular strengthening/stabilization; proximal LE flexibility/stretching; core & proximal LE strengthening   Percival Spanish, PT 12/31/2022, 11:59 AM

## 2022-12-28 ENCOUNTER — Ambulatory Visit (INDEPENDENT_AMBULATORY_CARE_PROVIDER_SITE_OTHER): Payer: 59

## 2022-12-28 DIAGNOSIS — I10 Essential (primary) hypertension: Secondary | ICD-10-CM | POA: Diagnosis not present

## 2022-12-28 MED ORDER — CHLORTHALIDONE 25 MG PO TABS: 25.0000 mg | ORAL_TABLET | Freq: Every day | ORAL | 0 refills | Status: DC

## 2022-12-28 MED ORDER — LOSARTAN POTASSIUM 50 MG PO TABS: 50.0000 mg | ORAL_TABLET | Freq: Two times a day (BID) | ORAL | 0 refills | Status: DC

## 2022-12-28 NOTE — Addendum Note (Signed)
Addended by: Anabel Halon on: 12/28/2022 10:01 AM   Modules accepted: Orders

## 2022-12-28 NOTE — Progress Notes (Addendum)
Pt here for Blood pressure check per Mackie Pai, PA-C 12/10/22: ".Follow up in 7 days nurse bp check or sooner if needed. Also please bring your bp machine in that day so we can compare your machine to our manual reading."   Pt currently takes: Losartan 50 mg- increased at last OV.   Pt states that he has been taking Losartan 50 mg BID- that this was how he was instructed to take it last Friday.   BP today Manual= 162/ 70 HR = 72   Pt advised per Edwrad: to continue Losartan 50 mg BID and will add Chlorthalidone 25 mg daily to regimen. Also to follow up with him in 2 weeks. Pt aware and voices understanding.    MA typed 50 mg chlorthalidone. That was not my instruction. I sent in 25 mg dose to take 1 tab daily.  Mackie Pai, PA-C

## 2022-12-31 ENCOUNTER — Other Ambulatory Visit: Payer: Self-pay

## 2022-12-31 ENCOUNTER — Ambulatory Visit: Payer: 59 | Attending: Family Medicine | Admitting: Physical Therapy

## 2022-12-31 ENCOUNTER — Encounter: Payer: Self-pay | Admitting: Physical Therapy

## 2022-12-31 ENCOUNTER — Ambulatory Visit: Payer: 59 | Admitting: Family Medicine

## 2022-12-31 VITALS — BP 138/90 | Ht 68.0 in | Wt 260.0 lb

## 2022-12-31 DIAGNOSIS — M25561 Pain in right knee: Secondary | ICD-10-CM | POA: Diagnosis present

## 2022-12-31 DIAGNOSIS — M25512 Pain in left shoulder: Secondary | ICD-10-CM | POA: Diagnosis present

## 2022-12-31 DIAGNOSIS — M23203 Derangement of unspecified medial meniscus due to old tear or injury, right knee: Secondary | ICD-10-CM | POA: Diagnosis not present

## 2022-12-31 DIAGNOSIS — M25612 Stiffness of left shoulder, not elsewhere classified: Secondary | ICD-10-CM | POA: Diagnosis present

## 2022-12-31 DIAGNOSIS — M25412 Effusion, left shoulder: Secondary | ICD-10-CM

## 2022-12-31 DIAGNOSIS — M6281 Muscle weakness (generalized): Secondary | ICD-10-CM | POA: Insufficient documentation

## 2022-12-31 DIAGNOSIS — R293 Abnormal posture: Secondary | ICD-10-CM | POA: Diagnosis present

## 2022-12-31 NOTE — Progress Notes (Signed)
  Jabar Krysiak - 62 y.o. male MRN [id number]  Date of birth: [birth year]  SUBJECTIVE:  Including CC & ROS.  No chief complaint on file.   Wilmore Holsomback is a 62 y.o. male that is following up for his left shoulder and right knee. His right knee is doing well. His left shoulder is acting up.    Review of Systems See HPI   HISTORY: Past Medical, Surgical, Social, and Family History Reviewed & Updated per EMR.   Pertinent Historical Findings include:  Past Medical History:  Diagnosis Date   Arthritis    Cancer ([place]) 2/14   prostate   Diabetes mellitus without complication ([place])    Headache    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   Sinus congestion    Sleep apnea    cpap    Past Surgical History:  Procedure Laterality Date   CARPAL TUNNEL RELEASE Right 04/05/2016   Procedure: RIGHT CARPAL TUNNEL RELEASE;  Surgeon: Leanora Cover, MD;  Location: [facility];  Service: Orthopedics;  Laterality: Right;   PROSTATE SURGERY  2/14   TONSILLECTOMY       PHYSICAL EXAM:  VS: BP (!) 138/90   Ht '5\' 8"'$  (1.727 m)   Wt 260 lb (117.9 kg)   BMI 39.53 kg/m  Physical Exam Gen: NAD, alert, cooperative with exam, well-appearing MSK:  Neurovascularly intact       ASSESSMENT & PLAN:   Degenerative tear of medial meniscus of right knee Doing well with bracing and conservative measures.  - counseled on home exercise therapy and supportive care - continue PT  - could consider custom orthotics.   Effusion of left shoulder Still having pain with abduction and pain with certain movements  - counseled on home exercise therapy and supportive care - continue physical therapy  - could consider injection.

## 2022-12-31 NOTE — Assessment & Plan Note (Signed)
Doing well with bracing and conservative measures.  - counseled on home exercise therapy and supportive care - continue PT  - could consider custom orthotics.

## 2022-12-31 NOTE — Patient Instructions (Signed)
Good to see you Please alternate heat and ice on the shoulder  Please continue with physical therapy  We can inject the shoulder if the pain interferes with therapy   Please send me a message in MyChart with any questions or updates.  Please see me back in 4 weeks or as needed if better.   --Dr. Raeford Razor

## 2022-12-31 NOTE — Assessment & Plan Note (Signed)
Still having pain with abduction and pain with certain movements  - counseled on home exercise therapy and supportive care - continue physical therapy  - could consider injection.

## 2023-01-07 ENCOUNTER — Ambulatory Visit: Payer: 59

## 2023-01-07 DIAGNOSIS — M25612 Stiffness of left shoulder, not elsewhere classified: Secondary | ICD-10-CM

## 2023-01-07 DIAGNOSIS — M25561 Pain in right knee: Secondary | ICD-10-CM

## 2023-01-07 DIAGNOSIS — M6281 Muscle weakness (generalized): Secondary | ICD-10-CM

## 2023-01-07 DIAGNOSIS — M25512 Pain in left shoulder: Secondary | ICD-10-CM

## 2023-01-07 DIAGNOSIS — R293 Abnormal posture: Secondary | ICD-10-CM

## 2023-01-07 NOTE — Therapy (Signed)
OUTPATIENT PHYSICAL THERAPY TREATMENT   Patient Name: Jerry Griffith MRN: [id number] DOB:30-Nov-1961, 62 y.o., male Today's Date: 01/07/2023  END OF SESSION:  PT End of Session - 01/07/23 0935     Visit Number 2    Date for PT Re-Evaluation 02/25/23    Authorization Type Aet[employer] - VL: [id number]    PT Start Time 0848    PT Stop Time 0931    PT Time Calculation (min) 43 min    Activity Tolerance Patient tolerated treatment well    Behavior During Therapy [facility] for tasks assessed/performed              Past Medical History:  Diagnosis Date   Arthritis    Cancer ([place]) 2/14   prostate   Diabetes mellitus without complication ([place])    Headache    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   Sinus congestion    Sleep apnea    cpap   Past Surgical History:  Procedure Laterality Date   CARPAL TUNNEL RELEASE Right 04/05/2016   Procedure: RIGHT CARPAL TUNNEL RELEASE;  Surgeon: Leanora Cover, MD;  Location: [place];  Service: Orthopedics;  Laterality: Right;   PROSTATE SURGERY  2/14   TONSILLECTOMY     Patient Active Problem List   Diagnosis Date Noted   Degenerative tear of medial meniscus of right knee 11/19/2022   Effusion of left shoulder 11/19/2022   GERD without esophagitis 08/07/2022   Mixed diabetic hyperlipidemia associated with type 2 diabetes mellitus ([place]) 08/07/2022   Essential hypertension 08/07/2022   Cellulitis of mid back region 08/07/2022   OSA on CPAP 08/07/2022   Uncontrolled type 2 diabetes mellitus with ketoacidosis without coma, without long-term current use of insulin ([place]) 08/06/2022   Malignant neoplasm of prostate ([place]) 08/20/2014    PCP: Mackie Pai, PA-C   REFERRING PROVIDER: Rosemarie Ax, MD   REFERRING DIAG:  [id number] (ICD-10-CM) - Degenerative tear of medial meniscus of right knee  M25.411 (ICD-10-CM) - Shoulder effusion, right   THERAPY DIAG:  Acute pain of left shoulder  Stiffness of left shoulder, not elsewhere  classified  Abnormal posture  Muscle weakness (generalized)  Acute pain of right knee  RATIONALE FOR EVALUATION AND TREATMENT: Rehabilitation  ONSET DATE: ~3 months  NEXT MD VISIT: 12/31/22   SUBJECTIVE:   SUBJECTIVE STATEMENT: Pt reports his knee is fine today but his shoulder still hurts.  PAIN: Are you having pain? Yes: NPRS scale: 7-8/10 Pain location: L anterolateral shoulder Pain description: sharp, wince Aggravating factors: raising L arm overhead or lifting with L arm Relieving factors: rubbing, heating pad  PERTINENT HISTORY: DM, HTN, OA, prostate cancer, OSA - CPAP, GERD, R CTR, recent surgery to remove cyst from back with post-op infection  PRECAUTIONS: None  WEIGHT BEARING RESTRICTIONS: No  FALLS:  Has patient fallen in last 6 months? 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 Make sure I'm stable and my knees don't give out."   OBJECTIVE:   DIAGNOSTIC FINDINGS:  11/19/22 - Limited ultrasound: Right knee, left shoulder:  Right knee:  Trace effusion  Normal quadricept and patellar tendon  Medial meniscus degenerative changes No changes in lateral meniscus    Left shoulder:  Mild effusion in the bicipital tendon sheath  Overlying effusion and ligament thickness of the subscapularis  Subacromial bursitis    Summary: degenerative meniscus tear of right knee and left shoulder effusion   PATIENT SURVEYS:  LEFS 70 / 80 = 87.5 % Quick Dash 25.0 / 100 = 25.0 %  COGNITION: Overall cognitive status: Within functional  limits for tasks assessed     SENSATION: WFL  EDEMA:  R knee previously swelling, but no resolved  MUSCLE LENGTH: Hamstrings: mild tight R>L ITB: mod tight R>L Piriformis: WFL Hip flexors: mild tight R>L Quads: mild/mod tight R>L  POSTURE:  rounded shoulders and forward head  PALPATION: Mildly TTP in L pecs, anterior deltoid. Mildly restricted R patellar mobility.  UPPER EXTREMITY ROM:   Active ROM Right eval Left Eval   Shoulder flexion 150 143 *  Shoulder extension 44 39  Shoulder abduction 161 150 *  Shoulder adduction    Shoulder internal rotation FIR T10 FIR T10 *  Shoulder external rotation FER T2 FER T2 *  Elbow flexion    Elbow extension    Wrist flexion    Wrist extension    Wrist ulnar deviation    Wrist radial deviation    Wrist pronation    Wrist supination    (Blank rows = not tested) * = pain  UPPER EXTREMITY MMT:  MMT Right eval Left eval  Shoulder flexion 5 4- *  Shoulder extension 5 4+  Shoulder abduction 5 4- *  Shoulder adduction    Shoulder internal rotation 5 4 *  Shoulder external rotation 5 4  Middle trapezius    Lower trapezius    Elbow flexion    Elbow extension    Wrist flexion    Wrist extension    Wrist ulnar deviation    Wrist radial deviation    Wrist pronation    Wrist supination    Grip strength (lbs)    (Blank rows = not tested) * = pain  SHOULDER SPECIAL TESTS: Impingement tests: Neer impingement test: positive  and Hawkins/Kennedy impingement test: positive  on L shoulder  LOWER EXTREMITY ROM:  Active ROM Right eval Left eval  Hip flexion    Hip extension    Hip abduction    Hip adduction    Hip internal rotation    Hip external rotation    Knee flexion 131 141  Knee extension 0 0  Ankle dorsiflexion    Ankle plantarflexion    Ankle inversion    Ankle eversion     (Blank rows = not tested)  LOWER EXTREMITY MMT:  MMT Right eval Left eval  Hip flexion 4+ 4+  Hip extension 4 4  Hip  abduction 4 4+  Hip adduction 4+ 4+  Hip internal rotation 5 5  Hip external rotation 4+ 4+  Knee flexion 5 5  Knee extension 5 5  Ankle dorsiflexion 5 5  Ankle plantarflexion    Ankle inversion    Ankle eversion     (Blank rows = not tested)  LOWER EXTREMITY SPECIAL TESTS:  Hip special tests: Ober's test: positive  Knee special tests: McMurray's test: negative   TODAY'S TREATMENT:  01/07/23 Therapeutic Exercise: Recumbent Bike L2x74mn Standing ER bil with GTB x 10  Low pec stretch 2x30 sec in doorway Standing row GTB x 10 3 sec  hold Standing shoulder extension GTB x 10 3 sec hold S/L L shoulder abduction thumb up x 10  S/L L shoulder ER x 10  S/L L shoulder flexion x 10 Knee flexion x 10   12/31/22 Eval only   PATIENT EDUCATION:  Education details: PT eval findings and anticipated POC Person educated: Patient Education method: Explanation Education comprehension: verbalized understanding  HOME EXERCISE PROGRAM: Access Code: [email or web address] URL: [email or web address] Date: 01/07/2023 Prepared by: Clarene Essex  Exercises - Shoulder External Rotation and Scapular Retraction with Resistance  - 1 x daily - 7 x weekly - 2 sets - 10 reps - Shoulder Extension with Anchored Resistance  - 1 x daily - 7 x weekly - 2 sets - 10 reps - Doorway Pec Stretch at 60 Degrees Abduction with Arm Straight  - 1 x daily - 7 x weekly - 3 sets - 30 sec hold - Sidelying Shoulder Abduction Palm Forward  - 1 x daily - 7 x weekly - 2 sets - 10 reps  ASSESSMENT:  CLINICAL IMPRESSION: Pt demonstrated the most pain in L shoulder this morning and with abduction movement. Focused on postural strengthening and providing instructions on proper positions of the shoulder to avoid impingement. He was able to do shoulder abduction in S/L with manual cues to keep shoulder depressed. He had no c/o knee pain so we focused on the shoulder today.  OBJECTIVE IMPAIRMENTS: decreased activity tolerance,  decreased knowledge of condition, decreased mobility, difficulty walking, decreased ROM, decreased strength, increased fascial restrictions, impaired perceived functional ability, increased muscle spasms, impaired flexibility, impaired UE functional use, improper body mechanics, postural dysfunction, and pain.   ACTIVITY LIMITATIONS: carrying, lifting, bending, standing, squatting, stairs, transfers, bathing, dressing, reach over head, locomotion level, and caring for others  PARTICIPATION LIMITATIONS: meal prep, cleaning, laundry, driving, shopping, community activity, occupation, and yard work  PERSONAL FACTORS: Fitness, Past/current experiences, Profession, Time since onset of injury/illness/exacerbation, and 3+ comorbidities: DM, HTN, OA, prostate cancer, OSA - CPAP, GERD, R CTR, recent surgery to remove cyst from back with post-op infection  are also affecting patient's functional outcome.   REHAB POTENTIAL: Excellent  CLINICAL DECISION MAKING: Stable/uncomplicated  EVALUATION COMPLEXITY: Low   GOALS: Goals reviewed with patient? Yes  SHORT TERM GOALS: Target date: 01/28/2023  Patient will be independent with initial HEP. Baseline:  Goal status: IN PROGRESS  LONG TERM GOALS: Target date: 02/25/2023  Patient will be independent with advanced/ongoing HEP to improve outcomes and carryover.  Baseline:  Goal status: IN PROGRESS  2.  Patient will report at least 75% improvement in L shoulder pain to improve QOL. Baseline:  Goal status: IN PROGRESS  3.  Patient to improve L shoulder AROM to WFL/WNL without pain provocation. Baseline:  Goal status: IN PROGRESS  4.  Patient will demonstrate improved L shoulder strength to >/= 4+/5 for functional UE use. Baseline:  Goal status: IN PROGRESS  5.  Patient will demonstrate improved B proximal LE strength to >/= 4+/5 for improved stability and ease of mobility. Baseline:  Goal status: IN PROGRESS  6.  Patient to report ability to  perform ADLs, household, and work-related tasks without limitation due to L shoulder pain, LOM or weakness.  Baseline:  Goal status: IN PROGRESS  7. Patient will be able to squat without limitation due to R knee pain or LOM to allow him to complete his job-related tasks.  Baseline:  Goal status: IN PROGRESS  8.  Patient will report </= 15% on QuickDASH to demonstrate improved  functional ability. Baseline: 25.0 / 100 = 25.0% Goal status: IN PROGRESS   PLAN:  PT FREQUENCY: 1-2x/week  PT DURATION: 8 weeks  PLANNED INTERVENTIONS: Therapeutic exercises, Therapeutic activity, Neuromuscular re-education, Balance training, Gait training, Patient/Family education, Self Care, Joint mobilization, Stair training, DME instructions, Aquatic Therapy, Dry Needling, Electrical stimulation, Cryotherapy, Moist heat, Taping, Vasopneumatic device, Ultrasound, Ionotophoresis '4mg'$ /ml Dexamethasone, Manual therapy, and Re-evaluation  PLAN FOR NEXT SESSION: postural awareness training with anterior stretching and scapular strengthening/stabilization; proximal LE flexibility/stretching; core & proximal LE strengthening   Artist Pais, PTA 01/07/2023, 9:35 AM

## 2023-01-08 ENCOUNTER — Other Ambulatory Visit: Payer: Self-pay | Admitting: Medical

## 2023-01-14 ENCOUNTER — Ambulatory Visit: Payer: 59 | Admitting: Physical Therapy

## 2023-01-14 ENCOUNTER — Encounter: Payer: Self-pay | Admitting: Physical Therapy

## 2023-01-14 ENCOUNTER — Ambulatory Visit: Payer: 59 | Admitting: Medical

## 2023-01-14 DIAGNOSIS — M25512 Pain in left shoulder: Secondary | ICD-10-CM

## 2023-01-14 DIAGNOSIS — M25561 Pain in right knee: Secondary | ICD-10-CM

## 2023-01-14 DIAGNOSIS — M6281 Muscle weakness (generalized): Secondary | ICD-10-CM

## 2023-01-14 DIAGNOSIS — M25612 Stiffness of left shoulder, not elsewhere classified: Secondary | ICD-10-CM

## 2023-01-14 DIAGNOSIS — R293 Abnormal posture: Secondary | ICD-10-CM

## 2023-01-14 NOTE — Therapy (Signed)
OUTPATIENT PHYSICAL THERAPY TREATMENT   Patient Name: Jerry Griffith MRN: [id number] DOB:September 14, 1961, 62 y.o., male Today's Date: 01/14/2023  END OF SESSION:  PT End of Session - 01/14/23 0845     Visit Number 3    Date for PT Re-Evaluation 02/25/23    Authorization Type Aetna - VL: 90    PT Start Time 0845    PT Stop Time 0931    PT Time Calculation (min) 46 min    Activity Tolerance Patient tolerated treatment well    Behavior During Therapy [facility] for tasks assessed/performed              Past Medical History:  Diagnosis Date   Arthritis    Cancer ([place]) 2/14   prostate   Diabetes mellitus without complication ([place])    Headache    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   Sinus congestion    Sleep apnea    cpap   Past Surgical History:  Procedure Laterality Date   CARPAL TUNNEL RELEASE Right 04/05/2016   Procedure: RIGHT CARPAL TUNNEL RELEASE;  Surgeon: Leanora Cover, MD;  Location: [place];  Service: Orthopedics;  Laterality: Right;   PROSTATE SURGERY  2/14   TONSILLECTOMY     Patient Active Problem List   Diagnosis Date Noted   Degenerative tear of medial meniscus of right knee 11/19/2022   Effusion of left shoulder 11/19/2022   GERD without esophagitis 08/07/2022   Mixed diabetic hyperlipidemia associated with type 2 diabetes mellitus ([place]) 08/07/2022   Essential hypertension 08/07/2022   Cellulitis of mid back region 08/07/2022   OSA on CPAP 08/07/2022   Uncontrolled type 2 diabetes mellitus with ketoacidosis without coma, without long-term current use of insulin ([place]) 08/06/2022   Malignant neoplasm of prostate ([place]) 08/20/2014    PCP: Mackie Pai, PA-C   REFERRING PROVIDER: Rosemarie Ax, MD   REFERRING DIAG:  [phone] (ICD-10-CM) - Degenerative tear of medial meniscus of right knee  M25.411 (ICD-10-CM) - Shoulder effusion, right   THERAPY DIAG:  Acute pain of left shoulder  Stiffness of left shoulder, not elsewhere  classified  Abnormal posture  Muscle weakness (generalized)  Acute pain of right knee  RATIONALE FOR EVALUATION AND TREATMENT: Rehabilitation  ONSET DATE: ~3 months  NEXT MD VISIT: 12/31/22   SUBJECTIVE:   SUBJECTIVE STATEMENT: Pt reports he still has pain in his shoulder but not as bad as last week - thinks the HEP is helping.  PAIN: Are you having pain? Yes: NPRS scale: steady 7/10 Pain location: L anterolateral shoulder Pain description: sharp, wince Aggravating factors: raising L arm overhead or lifting with L arm Relieving factors: rubbing, heating pad  PERTINENT HISTORY: DM, HTN, OA, prostate cancer, OSA - CPAP, GERD, R CTR, recent surgery to remove cyst from back with post-op infection  PRECAUTIONS: None  WEIGHT BEARING RESTRICTIONS: No  FALLS:  Has patient fallen in last 6 months? 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 Make sure I'm stable and my knees don't give out."   OBJECTIVE:  DIAGNOSTIC FINDINGS:  11/19/22 - Limited ultrasound: Right knee, left shoulder:   Right knee:  Trace effusion  Normal quadricept and patellar tendon  Medial meniscus degenerative changes No changes in lateral meniscus    Left shoulder:  Mild effusion in the bicipital tendon sheath  Overlying effusion and ligament thickness of the subscapularis  Subacromial bursitis    Summary: degenerative meniscus tear of right knee and left shoulder effusion   PATIENT SURVEYS:  LEFS 70 / 80 = 87.5 % Quick Dash 25.0 / 100 = 25.0  %  COGNITION: Overall cognitive status: Within functional limits for tasks assessed     SENSATION: WFL  EDEMA:  R knee previously swelling, but no resolved  MUSCLE LENGTH: Hamstrings: mild tight R>L ITB: mod tight R>L Piriformis: WFL Hip flexors: mild tight R>L Quads: mild/mod tight R>L  POSTURE:  rounded shoulders and forward head  PALPATION: Mildly TTP in L pecs, anterior deltoid. Mildly restricted R patellar mobility.  UPPER EXTREMITY ROM:   Active ROM Right eval Left Eval   Shoulder flexion 150 143 *  Shoulder extension 44 39  Shoulder abduction 161 150 *  Shoulder adduction    Shoulder internal rotation FIR T10 FIR T10 *  Shoulder external rotation FER T2 FER T2 *  Elbow flexion    Elbow extension    Wrist flexion    Wrist extension    Wrist ulnar deviation    Wrist radial deviation    Wrist pronation    Wrist supination    (Blank rows = not tested) * = pain  UPPER EXTREMITY MMT:  MMT Right eval Left eval  Shoulder flexion 5 4- *  Shoulder extension 5 4+  Shoulder abduction 5 4- *  Shoulder adduction    Shoulder internal rotation 5 4 *  Shoulder external rotation 5 4  Middle trapezius    Lower trapezius    Elbow flexion    Elbow extension    Wrist flexion    Wrist extension    Wrist ulnar deviation    Wrist radial deviation    Wrist pronation    Wrist supination    Grip strength (lbs)    (Blank rows = not tested) * = pain  SHOULDER SPECIAL TESTS: Impingement tests: Neer impingement test: positive  and Hawkins/Kennedy impingement test: positive  on L shoulder  LOWER EXTREMITY ROM:  Active ROM Right eval Left eval  Hip flexion    Hip extension    Hip abduction    Hip adduction    Hip internal rotation    Hip external rotation    Knee flexion 131 141  Knee extension 0 0  Ankle dorsiflexion    Ankle plantarflexion    Ankle inversion    Ankle eversion     (Blank rows = not tested)  LOWER EXTREMITY MMT:  MMT Right eval  Left eval  Hip flexion 4+ 4+  Hip extension 4 4  Hip abduction 4 4+  Hip adduction 4+ 4+  Hip internal rotation 5 5  Hip external rotation 4+ 4+  Knee flexion 5 5  Knee extension 5 5  Ankle dorsiflexion 5 5  Ankle plantarflexion    Ankle inversion    Ankle eversion     (Blank rows = not tested)  LOWER EXTREMITY SPECIAL TESTS:  Hip special tests: Ober's test: positive  Knee special tests: McMurray's test: negative   TODAY'S TREATMENT:  01/14/23 THERAPEUTIC EXERCISE: to improve flexibility, strength and mobility.  Verbal and tactile cues throughout  for technique. UBE - L2.0 x 6 min (3' each fwd & back) Low & high doorway pec stretches 3 x 30" - deferred mid level stretch d/t increased pain with starting position Standing GTB B scap retraction + shoulder ER 10 x 3" Standing GTB B scap retraction + shoulder extension 10 x 3" - VC & TC to increase scap retraction Standing GTB B scap retraction + shoulder horiz ABD at ~60 ABD 10 x 3", back against door frame for tactile reminder for scap  (increased pain at 90 ABD) Standing GTB B scap retraction + alt shoulder horiz ABD diagonals ABD 10 x 3" S/L L shoulder abduction thumb up x 10  Seated cervical retraction 10 x 5" Seated R/L UT stretch 2 x 30" Seated R/L LS stretch 2 x 30"  MANUAL THERAPY: To promote normalized muscle tension, improved flexibility, and reduced pain. STM and manual TPR to L pecs and supraspinatus, B UT and LS   01/07/23 Therapeutic Exercise: Recumbent Bike L2x80mn Standing ER bil with GTB x 10  Low pec stretch 2x30 sec in doorway Standing row GTB x 10 3 sec hold Standing shoulder extension GTB x 10 3 sec hold S/L L shoulder abduction thumb up x 10  S/L L shoulder ER x 10  S/L L shoulder flexion x 10 Knee flexion x 10    12/31/22 Eval only   PATIENT EDUCATION:  Education details: HEP update - cervical retraction & B UT/LS stretches Person educated: Patient Education method: Explanation,  Demonstration, Verbal cues, and Handouts Education comprehension: verbalized understanding, returned demonstration, verbal cues required, and needs further education  HOME EXERCISE PROGRAM: Access Code: [email or web address] URL: [email or web address] Date: 01/14/2023 Prepared by: JAnnie Paras Exercises - Shoulder External Rotation and Scapular Retraction with Resistance  - 1 x daily - 7 x weekly - 2 sets - 10 reps - Shoulder Extension with Anchored Resistance  - 1 x daily - 7 x weekly - 2 sets - 10 reps - Doorway Pec Stretch at 60 Degrees Abduction with Arm Straight  - 1 x daily - 7 x weekly - 3 sets - 30 sec hold - Sidelying Shoulder Abduction Palm Forward  - 1 x daily - 7 x weekly - 2 sets - 10 reps - Seated Cervical Retraction  - 2 x daily - 7 x weekly - 2 sets - 10 reps - 3-5 sec hold - Seated Gentle Upper Trapezius Stretch  - 2 x daily - 7 x weekly - 3 reps - 30 sec hold - Gentle Levator Scapulae Stretch  - 2 x daily - 7 x weekly - 3 reps - 30 sec hold   ASSESSMENT:  CLINICAL IMPRESSION: EJamicah Anstead reports he has been more aware of his posture when driving, avoiding resting his arm on the window ledge as well as focusing on now rounding his shoulders. He believes this and the HEP have helped reduce his pain. HEP reviewed with cues necessary for postural awareness and proper muscle activation with some exercises. Attempted to progress scapular/postural stretching and strengthening but limited tolerance for positioning at 90 ABD/horizontal ABD. Some "catching" noted during eccentric S/L ABD with limited eccentric control noted along with tendency for shoulder shrug/elevation. Increased muscle tension noted in L pecs, upper and posterior shoulder complex - addressed with MT followed by instruction in HEP stretches/exercises to help normalize muscle tension. EKelyn"RLiliane Channel will continue to benefit from skilled PT to address abnormal muscle tension as well as postural deficits to  improve mobility and activity  tolerance with decreased pain interference.   OBJECTIVE IMPAIRMENTS: decreased activity tolerance, decreased knowledge of condition, decreased mobility, difficulty walking, decreased ROM, decreased strength, increased fascial restrictions, impaired perceived functional ability, increased muscle spasms, impaired flexibility, impaired UE functional use, improper body mechanics, postural dysfunction, and pain.   ACTIVITY LIMITATIONS: carrying, lifting, bending, standing, squatting, stairs, transfers, bathing, dressing, reach over head, locomotion level, and caring for others  PARTICIPATION LIMITATIONS: meal prep, cleaning, laundry, driving, shopping, community activity, occupation, and yard work  PERSONAL FACTORS: Fitness, Past/current experiences, Profession, Time since onset of injury/illness/exacerbation, and 3+ comorbidities: DM, HTN, OA, prostate cancer, OSA - CPAP, GERD, R CTR, recent surgery to remove cyst from back with post-op infection  are also affecting patient's functional outcome.   REHAB POTENTIAL: Excellent  CLINICAL DECISION MAKING: Stable/uncomplicated  EVALUATION COMPLEXITY: Low   GOALS: Goals reviewed with patient? Yes  SHORT TERM GOALS: Target date: 01/28/2023  Patient will be independent with initial HEP. Baseline:  Goal status: IN PROGRESS  LONG TERM GOALS: Target date: 02/25/2023  Patient will be independent with advanced/ongoing HEP to improve outcomes and carryover.  Baseline:  Goal status: IN PROGRESS  2.  Patient will report at least 75% improvement in L shoulder pain to improve QOL. Baseline:  Goal status: IN PROGRESS  3.  Patient to improve L shoulder AROM to WFL/WNL without pain provocation. Baseline:  Goal status: IN PROGRESS  4.  Patient will demonstrate improved L shoulder strength to >/= 4+/5 for functional UE use. Baseline:  Goal status: IN PROGRESS  5.  Patient will demonstrate improved B proximal LE strength to  >/= 4+/5 for improved stability and ease of mobility. Baseline:  Goal status: IN PROGRESS  6.  Patient to report ability to perform ADLs, household, and work-related tasks without limitation due to L shoulder pain, LOM or weakness.  Baseline:  Goal status: IN PROGRESS  7. Patient will be able to squat without limitation due to R knee pain or LOM to allow him to complete his job-related tasks.  Baseline:  Goal status: IN PROGRESS  8.  Patient will report </= 15% on QuickDASH to demonstrate improved functional ability. Baseline: 25.0 / 100 = 25.0% Goal status: IN PROGRESS   PLAN:  PT FREQUENCY: 1-2x/week  PT DURATION: 8 weeks  PLANNED INTERVENTIONS: Therapeutic exercises, Therapeutic activity, Neuromuscular re-education, Balance training, Gait training, Patient/Family education, Self Care, Joint mobilization, Stair training, DME instructions, Aquatic Therapy, Dry Needling, Electrical stimulation, Cryotherapy, Moist heat, Taping, Vasopneumatic device, Ultrasound, Ionotophoresis '4mg'$ /ml Dexamethasone, Manual therapy, and Re-evaluation  PLAN FOR NEXT SESSION: postural awareness training with anterior stretching and scapular strengthening/stabilization; proximal LE flexibility/stretching; core & proximal LE strengthening   Percival Spanish, PT 01/14/2023, 9:46 AM

## 2023-01-21 ENCOUNTER — Ambulatory Visit: Payer: 59

## 2023-01-21 DIAGNOSIS — R293 Abnormal posture: Secondary | ICD-10-CM

## 2023-01-21 DIAGNOSIS — M25512 Pain in left shoulder: Secondary | ICD-10-CM | POA: Diagnosis not present

## 2023-01-21 DIAGNOSIS — M6281 Muscle weakness (generalized): Secondary | ICD-10-CM

## 2023-01-21 DIAGNOSIS — M25561 Pain in right knee: Secondary | ICD-10-CM

## 2023-01-21 DIAGNOSIS — M25612 Stiffness of left shoulder, not elsewhere classified: Secondary | ICD-10-CM

## 2023-01-21 NOTE — Therapy (Signed)
OUTPATIENT PHYSICAL THERAPY TREATMENT   Patient Name: Jerry Griffith MRN: [id number] DOB:01/16/1961, 62 y.o., male Today's Date: 01/21/2023  END OF SESSION:  PT End of Session - 01/21/23 0934     Visit Number 4    Date for PT Re-Evaluation 02/25/23    Authorization Type [employer] - VL: [id number]    PT Start Time 9983   pt arrived late   PT Stop Time 0932    PT Time Calculation (min) 33 min    Activity Tolerance Patient tolerated treatment well    Behavior During Therapy [facility] for tasks assessed/performed               Past Medical History:  Diagnosis Date   Arthritis    Cancer ([place]) 2/14   prostate   Diabetes mellitus without complication ([place])    Headache    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   Sinus congestion    Sleep apnea    cpap   Past Surgical History:  Procedure Laterality Date   CARPAL TUNNEL RELEASE Right 04/05/2016   Procedure: RIGHT CARPAL TUNNEL RELEASE;  Surgeon: Leanora Cover, MD;  Location: [place];  Service: Orthopedics;  Laterality: Right;   PROSTATE SURGERY  2/14   TONSILLECTOMY     Patient Active Problem List   Diagnosis Date Noted   Degenerative tear of medial meniscus of right knee 11/19/2022   Effusion of left shoulder 11/19/2022   GERD without esophagitis 08/07/2022   Mixed diabetic hyperlipidemia associated with type 2 diabetes mellitus ([place]) 08/07/2022   Essential hypertension 08/07/2022   Cellulitis of mid back region 08/07/2022   OSA on CPAP 08/07/2022   Uncontrolled type 2 diabetes mellitus with ketoacidosis without coma, without long-term current use of insulin ([place]) 08/06/2022   Malignant neoplasm of prostate ([place]) 08/20/2014    PCP: Mackie Pai, PA-C   REFERRING PROVIDER: Rosemarie Ax, MD   REFERRING DIAG:  [phone] (ICD-10-CM) - Degenerative tear of medial meniscus of right knee  M25.411 (ICD-10-CM) - Shoulder effusion, right   THERAPY DIAG:  Acute pain of left shoulder  Stiffness of left  shoulder, not elsewhere classified  Abnormal posture  Muscle weakness (generalized)  Acute pain of right knee  RATIONALE FOR EVALUATION AND TREATMENT: Rehabilitation  ONSET DATE: ~3 months  NEXT MD VISIT: 12/31/22   SUBJECTIVE:   SUBJECTIVE STATEMENT: Pt reports improved L shoulder pain and also more improvement since doing neck exercises.   PAIN: Are you having pain? Yes: NPRS scale:  7/10 Pain location: L anterolateral shoulder Pain description: sharp, wince Aggravating factors: raising L arm overhead or lifting with L arm Relieving factors: rubbing, heating pad  PERTINENT HISTORY: DM, HTN, OA, prostate cancer, OSA - CPAP, GERD, R CTR, recent surgery to remove cyst from back with post-op infection  PRECAUTIONS: None  WEIGHT BEARING RESTRICTIONS: No  FALLS:  Has patient fallen in last 6 months? 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 (not recently)  PATIENT GOALS: "To get rid of the pain and have that arm (L UE) flexible. Make sure I'm stable and my knees don't give out."   OBJECTIVE:   DIAGNOSTIC FINDINGS:  11/19/22 - Limited ultrasound: Right knee, left shoulder:   Right knee:  Trace effusion  Normal quadricept and patellar tendon  Medial meniscus degenerative changes No changes in lateral meniscus    Left shoulder:  Mild effusion in the bicipital tendon sheath  Overlying effusion and ligament thickness of the subscapularis  Subacromial bursitis    Summary: degenerative meniscus tear of right knee and left shoulder effusion   PATIENT SURVEYS:  LEFS 70 / 80 = 87.5 % Quick Dash 25.0 / 100 = 25.0  %  COGNITION: Overall cognitive status: Within functional limits for tasks assessed     SENSATION: WFL  EDEMA:  R knee previously swelling, but no resolved  MUSCLE LENGTH: Hamstrings: mild tight R>L ITB: mod tight R>L Piriformis: WFL Hip flexors: mild tight R>L Quads: mild/mod tight R>L  POSTURE:  rounded shoulders and forward head  PALPATION: Mildly TTP in L pecs, anterior deltoid. Mildly restricted R patellar mobility.  UPPER EXTREMITY ROM:   Active ROM Right eval Left Eval   Shoulder flexion 150 143 *  Shoulder extension 44 39  Shoulder abduction 161 150 *  Shoulder adduction    Shoulder internal rotation FIR T10 FIR T10 *  Shoulder external rotation FER T2 FER T2 *  Elbow flexion    Elbow extension    Wrist flexion    Wrist extension    Wrist ulnar deviation    Wrist radial deviation    Wrist pronation    Wrist supination    (Blank rows = not tested) * = pain  UPPER EXTREMITY MMT:  MMT Right eval Left eval  Shoulder flexion 5 4- *  Shoulder extension 5 4+  Shoulder abduction 5 4- *  Shoulder adduction    Shoulder internal rotation 5 4 *  Shoulder external rotation 5 4  Middle trapezius    Lower trapezius    Elbow flexion    Elbow extension    Wrist flexion    Wrist extension    Wrist ulnar deviation    Wrist radial deviation    Wrist pronation    Wrist supination    Grip strength (lbs)    (Blank rows = not tested) * = pain  SHOULDER SPECIAL TESTS: Impingement tests: Neer impingement test: positive  and Hawkins/Kennedy impingement test: positive  on L shoulder  LOWER EXTREMITY ROM:  Active ROM Right eval Left eval  Hip flexion    Hip extension    Hip abduction    Hip adduction    Hip internal rotation    Hip external rotation    Knee flexion 131 141  Knee extension 0 0  Ankle dorsiflexion    Ankle plantarflexion    Ankle inversion    Ankle eversion     (Blank rows = not tested)  LOWER EXTREMITY MMT:  MMT Right eval  Left eval  Hip flexion 4+ 4+  Hip extension 4 4  Hip abduction 4 4+  Hip adduction 4+ 4+  Hip internal rotation 5 5  Hip external rotation 4+ 4+  Knee flexion 5 5  Knee extension 5 5  Ankle dorsiflexion 5 5  Ankle plantarflexion    Ankle inversion    Ankle eversion     (Blank rows = not tested)  LOWER EXTREMITY SPECIAL TESTS:  Hip special tests: Ober's test: positive  Knee special tests: McMurray's test: negative   TODAY'S TREATMENT: 01/21/23 THERAPEUTIC EXERCISE: to improve flexibility, strength and mobility.  Verbal and tactile cues throughout for technique. Pulleys: 2  min flexion, 2 min scaption Standing GTB ER + scap retraction at doorframe 10x5" Standing horiz ABD GTB at doorframe 10x3" BATCA row 35# 2x10 high grips S/L L shoulder abduction 2# x 10 - mild increase in pain S/L L shld ER 2#- very fatigued in shoulder  MANUAL THERAPY: To promote normalized muscle tension, improved flexibility, and reduced pain.  STM to L supraspinatus and UT  01/14/23 THERAPEUTIC EXERCISE: to improve flexibility, strength and mobility.  Verbal and tactile cues throughout for technique. UBE - L2.0 x 6 min (3' each fwd & back) Low & high doorway pec stretches 3 x 30" - deferred mid level stretch d/t increased pain with starting position Standing GTB B scap retraction + shoulder ER 10 x 3" Standing GTB B scap retraction + shoulder extension 10 x 3" - VC & TC to increase scap retraction Standing GTB B scap retraction + shoulder horiz ABD at ~60 ABD 10 x 3", back against door frame for tactile reminder for scap  (increased pain at 90 ABD) Standing GTB B scap retraction + alt shoulder horiz ABD diagonals ABD 10 x 3" S/L L shoulder abduction thumb up x 10  Seated cervical retraction 10 x 5" Seated R/L UT stretch 2 x 30" Seated R/L LS stretch 2 x 30"  MANUAL THERAPY: To promote normalized muscle tension, improved flexibility, and reduced pain. STM and manual TPR to L pecs and supraspinatus, B  UT and LS   01/07/23 Therapeutic Exercise: Recumbent Bike L2x40mn Standing ER bil with GTB x 10  Low pec stretch 2x30 sec in doorway Standing row GTB x 10 3 sec hold Standing shoulder extension GTB x 10 3 sec hold S/L L shoulder abduction thumb up x 10  S/L L shoulder ER x 10  S/L L shoulder flexion x 10 Knee flexion x 10    12/31/22 Eval only   PATIENT EDUCATION:  Education details: HEP update - cervical retraction & B UT/LS stretches Person educated: Patient Education method: Explanation, Demonstration, Verbal cues, and Handouts Education comprehension: verbalized understanding, returned demonstration, verbal cues required, and needs further education  HOME EXERCISE PROGRAM: Access Code: [email or web address] URL: [email or web address] Date: 01/14/2023 Prepared by: JAnnie Paras Exercises - Shoulder External Rotation and Scapular Retraction with Resistance  - 1 x daily - 7 x weekly - 2 sets - 10 reps - Shoulder Extension with Anchored Resistance  - 1 x daily - 7 x weekly - 2 sets - 10 reps - Doorway Pec Stretch at 60 Degrees Abduction with Arm Straight  - 1 x daily - 7 x weekly - 3 sets - 30 sec hold - Sidelying Shoulder Abduction Palm Forward  - 1 x daily - 7 x weekly - 2 sets - 10 reps - Seated Cervical Retraction  - 2 x daily - 7 x weekly - 2 sets - 10 reps - 3-5 sec hold - Seated Gentle Upper Trapezius Stretch  - 2 x daily - 7 x weekly - 3 reps - 30 sec hold - Gentle Levator Scapulae Stretch  - 2 x daily - 7 x weekly - 3 reps - 30 sec hold   ASSESSMENT:  CLINICAL IMPRESSION: Advanced patient through [employer]Ex to improve L shoulder strength and mobility to improve functional use of shoulder. Catching in L shoulder was noted with pulleys and  tactile cues required to keep shoulder depressed and retracted to avoid catching. Pt became fatigued with S/L shld ABD and ER with 2# weight, noting increased soreness along the supraspinatus muscle.  Continued with MT to improve L  shoulder pain and reduce muscle tension inhibiting pain free movement. No questions/concerns with HEP.  OBJECTIVE IMPAIRMENTS: decreased activity tolerance, decreased knowledge of condition, decreased mobility, difficulty walking, decreased ROM, decreased strength, increased fascial restrictions, impaired perceived functional ability, increased muscle spasms, impaired flexibility, impaired UE functional use, improper body mechanics, postural dysfunction, and pain.   ACTIVITY LIMITATIONS: carrying, lifting, bending, standing, squatting, stairs, transfers, bathing, dressing, reach over head, locomotion level, and caring for others  PARTICIPATION LIMITATIONS: meal prep, cleaning, laundry, driving, shopping, community activity, occupation, and yard work  PERSONAL FACTORS: Fitness, Past/current experiences, Profession, Time since onset of injury/illness/exacerbation, and 3+ comorbidities: DM, HTN, OA, prostate cancer, OSA - CPAP, GERD, R CTR, recent surgery to remove cyst from back with post-op infection  are also affecting patient's functional outcome.   REHAB POTENTIAL: Excellent  CLINICAL DECISION MAKING: Stable/uncomplicated  EVALUATION COMPLEXITY: Low   GOALS: Goals reviewed with patient? Yes  SHORT TERM GOALS: Target date: 01/28/2023  Patient will be independent with initial HEP. Baseline:  Goal status: MET- 01/21/23  LONG TERM GOALS: Target date: 02/25/2023  Patient will be independent with advanced/ongoing HEP to improve outcomes and carryover.  Baseline:  Goal status: IN PROGRESS  2.  Patient will report at least 75% improvement in L shoulder pain to improve QOL. Baseline:  Goal status: IN PROGRESS  3.  Patient to improve L shoulder AROM to WFL/WNL without pain provocation. Baseline:  Goal status: IN PROGRESS  4.  Patient will demonstrate improved L shoulder strength to >/= 4+/5 for functional UE use. Baseline:  Goal status: IN PROGRESS  5.  Patient will demonstrate  improved B proximal LE strength to >/= 4+/5 for improved stability and ease of mobility. Baseline:  Goal status: IN PROGRESS  6.  Patient to report ability to perform ADLs, household, and work-related tasks without limitation due to L shoulder pain, LOM or weakness.  Baseline:  Goal status: IN PROGRESS  7. Patient will be able to squat without limitation due to R knee pain or LOM to allow him to complete his job-related tasks.  Baseline:  Goal status: IN PROGRESS  8.  Patient will report </= 15% on QuickDASH to demonstrate improved functional ability. Baseline: 25.0 / 100 = 25.0% Goal status: IN PROGRESS   PLAN:  PT FREQUENCY: 1-2x/week  PT DURATION: 8 weeks  PLANNED INTERVENTIONS: Therapeutic exercises, Therapeutic activity, Neuromuscular re-education, Balance training, Gait training, Patient/Family education, Self Care, Joint mobilization, Stair training, DME instructions, Aquatic Therapy, Dry Needling, Electrical stimulation, Cryotherapy, Moist heat, Taping, Vasopneumatic device, Ultrasound, Ionotophoresis '4mg'$ /ml Dexamethasone, Manual therapy, and Re-evaluation  PLAN FOR NEXT SESSION: postural awareness training with anterior stretching and scapular strengthening/stabilization; proximal LE flexibility/stretching; core & proximal LE strengthening   Artist Pais, PTA 01/21/2023, 9:35 AM

## 2023-01-23 ENCOUNTER — Encounter: Payer: Self-pay | Admitting: Medical

## 2023-01-23 DIAGNOSIS — E119 Type 2 diabetes mellitus without complications: Secondary | ICD-10-CM

## 2023-01-28 ENCOUNTER — Encounter: Payer: Self-pay | Admitting: Physical Therapy

## 2023-01-28 ENCOUNTER — Ambulatory Visit: Payer: 59 | Attending: Family Medicine | Admitting: Physical Therapy

## 2023-01-28 DIAGNOSIS — M25512 Pain in left shoulder: Secondary | ICD-10-CM | POA: Insufficient documentation

## 2023-01-28 DIAGNOSIS — M25612 Stiffness of left shoulder, not elsewhere classified: Secondary | ICD-10-CM | POA: Insufficient documentation

## 2023-01-28 DIAGNOSIS — M6281 Muscle weakness (generalized): Secondary | ICD-10-CM | POA: Diagnosis present

## 2023-01-28 DIAGNOSIS — M25561 Pain in right knee: Secondary | ICD-10-CM | POA: Diagnosis present

## 2023-01-28 DIAGNOSIS — R293 Abnormal posture: Secondary | ICD-10-CM | POA: Insufficient documentation

## 2023-01-28 NOTE — Therapy (Signed)
OUTPATIENT PHYSICAL THERAPY TREATMENT   Patient Name: Jerry Griffith MRN: [id number] DOB:1961-12-04, 61 y.o., male Today's Date: 01/28/2023  END OF SESSION:  PT End of Session - 01/28/23 0853     Visit Number 5    Date for PT Re-Evaluation 02/25/23    Authorization Type [employer] - VL: [id number]    PT Start Time 0853   Pt arrived late   PT Stop Time 0931    PT Time Calculation (min) 38 min    Activity Tolerance Patient tolerated treatment well    Behavior During Therapy [facility] for tasks assessed/performed               Past Medical History:  Diagnosis Date   Arthritis    Cancer ([place]) 2/14   prostate   Diabetes mellitus without complication ([place])    Headache    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   Sinus congestion    Sleep apnea    cpap   Past Surgical History:  Procedure Laterality Date   CARPAL TUNNEL RELEASE Right 04/05/2016   Procedure: RIGHT CARPAL TUNNEL RELEASE;  Surgeon: Leanora Cover, MD;  Location: [place];  Service: Orthopedics;  Laterality: Right;   PROSTATE SURGERY  2/14   TONSILLECTOMY     Patient Active Problem List   Diagnosis Date Noted   Degenerative tear of medial meniscus of right knee 11/19/2022   Effusion of left shoulder 11/19/2022   GERD without esophagitis 08/07/2022   Mixed diabetic hyperlipidemia associated with type 2 diabetes mellitus ([place]) 08/07/2022   Essential hypertension 08/07/2022   Cellulitis of mid back region 08/07/2022   OSA on CPAP 08/07/2022   Uncontrolled type 2 diabetes mellitus with ketoacidosis without coma, without long-term current use of insulin ([place]) 08/06/2022   Malignant neoplasm of prostate ([place]) 08/20/2014    PCP: Mackie Pai, PA-C   REFERRING PROVIDER: Rosemarie Ax, MD   REFERRING DIAG:  [phone] (ICD-10-CM) - Degenerative tear of medial meniscus of right knee  M25.411 (ICD-10-CM) - Shoulder effusion, right   THERAPY DIAG:  Acute pain of left shoulder  Stiffness of left  shoulder, not elsewhere classified  Abnormal posture  Muscle weakness (generalized)  Acute pain of right knee  RATIONALE FOR EVALUATION AND TREATMENT: Rehabilitation  ONSET DATE: ~3 months  NEXT MD VISIT: 02/04/23   SUBJECTIVE:   SUBJECTIVE STATEMENT: Pt reports his pain has been fading slowly. He feels that the neck exercises has also been beneficial - he notes less stress on the side of the neck.  PAIN: Are you having pain? No  PERTINENT HISTORY: DM, HTN, OA, prostate cancer, OSA - CPAP, GERD, R CTR, recent surgery to remove cyst from back with post-op infection  PRECAUTIONS: None  WEIGHT BEARING RESTRICTIONS: No  FALLS:  Has patient fallen in last 6 months? 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 (not recently)  PATIENT GOALS: "To get rid of the pain and have that arm (L UE) flexible. Make sure I'm stable and my knees don't give out."   OBJECTIVE:   DIAGNOSTIC FINDINGS:  11/19/22 - Limited ultrasound: Right knee, left shoulder:   Right knee:  Trace  effusion  Normal quadricept and patellar tendon  Medial meniscus degenerative changes No changes in lateral meniscus    Left shoulder:  Mild effusion in the bicipital tendon sheath  Overlying effusion and ligament thickness of the subscapularis  Subacromial bursitis    Summary: degenerative meniscus tear of right knee and left shoulder effusion   PATIENT SURVEYS:  LEFS 70 / 80 = 87.5 % Quick Dash 25.0 / 100 = 25.0 %  COGNITION: Overall cognitive status: Within functional limits for tasks assessed     SENSATION: WFL  EDEMA:  R knee previously swelling, but  no resolved  MUSCLE LENGTH: Hamstrings: mild tight R>L ITB: mod tight R>L Piriformis: WFL Hip flexors: mild tight R>L Quads: mild/mod tight R>L  POSTURE:  rounded shoulders and forward head  PALPATION: Mildly TTP in L pecs, anterior deltoid. Mildly restricted R patellar mobility.  UPPER EXTREMITY ROM:   Active ROM Right eval Left Eval   Shoulder flexion 150 143 *  Shoulder extension 44 39  Shoulder abduction 161 150 *  Shoulder adduction    Shoulder internal rotation FIR T10 FIR T10 *  Shoulder external rotation FER T2 FER T2 *  Elbow flexion    Elbow extension    Wrist flexion    Wrist extension    Wrist ulnar deviation    Wrist radial deviation    Wrist pronation    Wrist supination    (Blank rows = not tested) * = pain  UPPER EXTREMITY MMT:  MMT Right eval Left eval  Shoulder flexion 5 4- *  Shoulder extension 5 4+  Shoulder abduction 5 4- *  Shoulder adduction    Shoulder internal rotation 5 4 *  Shoulder external rotation 5 4  Middle trapezius    Lower trapezius    Elbow flexion    Elbow extension    Wrist flexion    Wrist extension    Wrist ulnar deviation    Wrist radial deviation    Wrist pronation    Wrist supination    Grip strength (lbs)    (Blank rows = not tested) * = pain  SHOULDER SPECIAL TESTS: Impingement tests: Neer impingement test: positive  and Hawkins/Kennedy impingement test: positive  on L shoulder  LOWER EXTREMITY ROM:  Active ROM Right eval Left eval  Hip flexion    Hip extension    Hip abduction    Hip adduction    Hip internal rotation    Hip external rotation    Knee flexion 131 141  Knee extension 0 0  Ankle dorsiflexion    Ankle plantarflexion    Ankle inversion    Ankle eversion     (Blank rows = not tested)  LOWER EXTREMITY MMT:  MMT Right eval Left eval  Hip flexion 4+ 4+  Hip extension 4 4  Hip abduction 4 4+  Hip adduction 4+ 4+  Hip internal rotation 5 5  Hip external rotation 4+ 4+   Knee flexion 5 5  Knee extension 5 5  Ankle dorsiflexion 5 5  Ankle plantarflexion    Ankle inversion    Ankle eversion     (Blank rows = not tested)  LOWER EXTREMITY SPECIAL TESTS:  Hip special tests: Ober's test: positive  Knee special tests: McMurray's test: negative   TODAY'S TREATMENT:  01/28/23 THERAPEUTIC EXERCISE: to improve flexibility, strength and mobility.  Verbal and tactile cues throughout for technique. UBE - L3.0 x 6 min (3' each fwd & back) Prone over green Pball  from knees on Airex pad - I's, T's, Y's and W's x 10 each - cues for thoracic extension & cervical retraction Standing GTB scap retraction + B shoulder ER with back against doorframe 10 x 5" Standing GTB scap retraction + B shoulder horiz ABD with back against doorframe 10 x 5" Standing GTB scap retraction + B shoulder horiz ABD diagonals with back against doorframe 10 x 5" Serratus push-ups x 10 Serratus roll-ups on 6" FR with looped RTB at wrists x 10 Serratus slides with looped RTB at wrists x 10 Serratus clocks on wall with looped RTB at wrists x 10 - pt noting mild discomfort Standing GTB B scap retraction + shoulder extension 10 x 3" - VC to avoid shoulder shrug   01/21/23 THERAPEUTIC EXERCISE: to improve flexibility, strength and mobility.  Verbal and tactile cues throughout for technique. Pulleys: 2 min flexion, 2 min scaption Standing GTB ER + scap retraction at doorframe 10x5" Standing horiz ABD GTB at doorframe 10x3" BATCA row 35# 2x10 high grips S/L L shoulder abduction 2# x 10 - mild increase in pain S/L L shld ER 2#- very fatigued in shoulder  MANUAL THERAPY: To promote normalized muscle tension, improved flexibility, and reduced pain.  STM to L supraspinatus and UT   01/14/23 THERAPEUTIC EXERCISE: to improve flexibility, strength and mobility.  Verbal and tactile cues throughout for technique. UBE - L2.0 x 6 min (3' each fwd & back) Low & high doorway pec stretches 3 x 30" - deferred  mid level stretch d/t increased pain with starting position Standing GTB B scap retraction + shoulder ER 10 x 3" Standing GTB B scap retraction + shoulder extension 10 x 3" - VC & TC to increase scap retraction Standing GTB B scap retraction + shoulder horiz ABD at ~60 ABD 10 x 3", back against door frame for tactile reminder for scap  (increased pain at 90 ABD) Standing GTB B scap retraction + alt shoulder horiz ABD diagonals ABD 10 x 3" S/L L shoulder abduction thumb up x 10  Seated cervical retraction 10 x 5" Seated R/L UT stretch 2 x 30" Seated R/L LS stretch 2 x 30"  MANUAL THERAPY: To promote normalized muscle tension, improved flexibility, and reduced pain. STM and manual TPR to L pecs and supraspinatus, B UT and LS   PATIENT EDUCATION:  Education details: HEP update - trap and serratus strengthening Person educated: Patient Education method: Explanation, Demonstration, and Handouts Education comprehension: verbalized understanding, returned demonstration, and needs further education  HOME EXERCISE PROGRAM: Access Code: [email or web address] URL: [email or web address] Date: 01/28/2023 Prepared by: Annie Paras  Exercises - Shoulder External Rotation and Scapular Retraction with Resistance  - 1 x daily - 7 x weekly - 2 sets - 10 reps - Shoulder Extension with Anchored Resistance  - 1 x daily - 7 x weekly - 2 sets - 10 reps - Doorway Pec Stretch at 60 Degrees Abduction with Arm Straight  - 1 x daily - 7 x weekly - 3 sets - 30 sec hold - Sidelying Shoulder Abduction Palm Forward  - 1 x daily - 7 x weekly - 2 sets - 10 reps - Seated Cervical Retraction  - 2 x daily - 7 x weekly - 2 sets - 10 reps - 3-5 sec hold - Seated Gentle Upper Trapezius Stretch  - 2 x daily - 7 x weekly - 3 reps - 30 sec hold - Gentle Levator Scapulae Stretch  - 2 x daily - 7 x weekly -  3 reps - 30 sec hold - Serratus Forearm Push Up Plus at Wall with Elbows  - 1 x daily - 3-4 x weekly - 2 sets - 10 reps  - 3 sec hold - Serratus Activation at Wall with Foam Roll and Resistance Band  - 1 x daily - 3-4 x weekly - 2 sets - 10 reps - 3 sec hold - Standing Shoulder Horizontal Abduction with Resistance  - 1 x daily - 3-4 x weekly - 2 sets - 10 reps - 3 sec hold - Standing Shoulder Diagonal Horizontal Abduction 60/120 Degrees with Resistance  - 1 x daily - 3-4 x weekly - 2 sets - 10 reps - 3 sec hold   ASSESSMENT:  CLINICAL IMPRESSION: Younes Degeorge" reports his L shoulder pain has been gradually fading with less stress in lateral neck as well - 75% reduction in pain reported - LTG #2. Continued scapular strengthening adding gravity resisted trap activation as well as serratus strengthening due to continued L scapular winging noted. Good tolerance for most exercises but some mild discomfort noted with serratus wall clocks. HEP progressed to reflect strengthening progression. Ezekiel "Liliane Channel" will continue to benefit from skilled PT to address ongoing deficits to improve mobility and activity tolerance with decreased pain interference.   OBJECTIVE IMPAIRMENTS: decreased activity tolerance, decreased knowledge of condition, decreased mobility, difficulty walking, decreased ROM, decreased strength, increased fascial restrictions, impaired perceived functional ability, increased muscle spasms, impaired flexibility, impaired UE functional use, improper body mechanics, postural dysfunction, and pain.   ACTIVITY LIMITATIONS: carrying, lifting, bending, standing, squatting, stairs, transfers, bathing, dressing, reach over head, locomotion level, and caring for others  PARTICIPATION LIMITATIONS: meal prep, cleaning, laundry, driving, shopping, community activity, occupation, and yard work  PERSONAL FACTORS: Fitness, Past/current experiences, Profession, Time since onset of injury/illness/exacerbation, and 3+ comorbidities: DM, HTN, OA, prostate cancer, OSA - CPAP, GERD, R CTR, recent surgery to remove cyst from back  with post-op infection  are also affecting patient's functional outcome.   REHAB POTENTIAL: Excellent  CLINICAL DECISION MAKING: Stable/uncomplicated  EVALUATION COMPLEXITY: Low   GOALS: Goals reviewed with patient? Yes  SHORT TERM GOALS: Target date: 01/28/2023  Patient will be independent with initial HEP. Baseline:  Goal status: MET  01/21/23  LONG TERM GOALS: Target date: 02/25/2023  Patient will be independent with advanced/ongoing HEP to improve outcomes and carryover.  Baseline:  Goal status: IN PROGRESS  2.  Patient will report at least 75% improvement in L shoulder pain to improve QOL. Baseline:  Goal status: MET  01/28/23 - Pt reports 75% reduction in pain allowing for better movement tolerance   3.  Patient to improve L shoulder AROM to WFL/WNL without pain provocation. Baseline:  Goal status: IN PROGRESS  4.  Patient will demonstrate improved L shoulder strength to >/= 4+/5 for functional UE use. Baseline:  Goal status: IN PROGRESS  5.  Patient will demonstrate improved B proximal LE strength to >/= 4+/5 for improved stability and ease of mobility. Baseline:  Goal status: IN PROGRESS  6.  Patient to report ability to perform ADLs, household, and work-related tasks without limitation due to L shoulder pain, LOM or weakness.  Baseline:  Goal status: IN PROGRESS  7. Patient will be able to squat without limitation due to R knee pain or LOM to allow him to complete his job-related tasks.  Baseline:  Goal status: IN PROGRESS  8.  Patient will report </= 15% on QuickDASH to demonstrate improved functional ability. Baseline: 25.0 /  100 = 25.0% Goal status: IN PROGRESS   PLAN:  PT FREQUENCY: 1-2x/week  PT DURATION: 8 weeks  PLANNED INTERVENTIONS: Therapeutic exercises, Therapeutic activity, Neuromuscular re-education, Balance training, Gait training, Patient/Family education, Self Care, Joint mobilization, Stair training, DME instructions, Aquatic Therapy, Dry  Needling, Electrical stimulation, Cryotherapy, Moist heat, Taping, Vasopneumatic device, Ultrasound, Ionotophoresis '4mg'$ /ml Dexamethasone, Manual therapy, and Re-evaluation  PLAN FOR NEXT SESSION: MD PN for 02/04/23 - reassess shoulder ROM & strength, LTGs; postural awareness training with anterior stretching and scapular strengthening/stabilization; proximal LE flexibility/stretching; core & proximal LE strengthening   Percival Spanish, PT 01/28/2023, 9:40 AM

## 2023-01-29 ENCOUNTER — Other Ambulatory Visit: Payer: Self-pay | Admitting: Medical

## 2023-02-04 ENCOUNTER — Other Ambulatory Visit: Payer: Self-pay | Admitting: Medical

## 2023-02-04 ENCOUNTER — Ambulatory Visit: Payer: 59

## 2023-02-04 ENCOUNTER — Ambulatory Visit: Payer: 59 | Admitting: Family Medicine

## 2023-02-08 NOTE — Therapy (Incomplete)
OUTPATIENT PHYSICAL THERAPY TREATMENT   Patient Name: Jerry Griffith MRN: [id number] DOB:08-May-1961, 62 y.o., male Today's Date: 02/08/2023  END OF SESSION:      Past Medical History:  Diagnosis Date   Arthritis    Cancer ([place]) 2/14   prostate   Diabetes mellitus without complication ([place])    Headache    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   Sinus congestion    Sleep apnea    cpap   Past Surgical History:  Procedure Laterality Date   CARPAL TUNNEL RELEASE Right 04/05/2016   Procedure: RIGHT CARPAL TUNNEL RELEASE;  Surgeon: Leanora Cover, MD;  Location: [facility];  Service: Orthopedics;  Laterality: Right;   PROSTATE SURGERY  2/14   TONSILLECTOMY     Patient Active Problem List   Diagnosis Date Noted   Degenerative tear of medial meniscus of right knee 11/19/2022   Effusion of left shoulder 11/19/2022   GERD without esophagitis 08/07/2022   Mixed diabetic hyperlipidemia associated with type 2 diabetes mellitus ([place]) 08/07/2022   Essential hypertension 08/07/2022   Cellulitis of mid back region 08/07/2022   OSA on CPAP 08/07/2022   Uncontrolled type 2 diabetes mellitus with ketoacidosis without coma, without long-term current use of insulin ([place]) 08/06/2022   Malignant neoplasm of prostate ([place]) 08/20/2014    PCP: Mackie Pai, PA-C   REFERRING PROVIDER: Rosemarie Ax, MD   REFERRING DIAG:  [phone] (ICD-10-CM) - Degenerative tear of medial meniscus of right knee  M25.411 (ICD-10-CM) - Shoulder effusion, right   THERAPY DIAG:  No diagnosis found.  RATIONALE FOR EVALUATION AND TREATMENT: Rehabilitation  ONSET DATE: ~3 months  NEXT MD VISIT: 02/04/23 ***    SUBJECTIVE:   SUBJECTIVE STATEMENT: *** ask about MD visit     Pt reports his pain has been fading slowly. He feels that the neck exercises has also been beneficial - he notes less stress on the side of the neck.  PAIN: Are you having pain? No  PERTINENT  HISTORY: DM, HTN, OA, prostate cancer, OSA - CPAP, GERD, R CTR, recent surgery to remove cyst from back with post-op infection  PRECAUTIONS: None  WEIGHT BEARING RESTRICTIONS: No  FALLS:  Has patient fallen in last 6 months? 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 Make sure I'm stable and my knees don't give out."   OBJECTIVE:   DIAGNOSTIC FINDINGS:  11/19/22 - Limited ultrasound: Right knee, left shoulder:   Right knee:  Trace effusion  Normal quadricept and patellar tendon  Medial meniscus degenerative changes No changes in lateral meniscus    Left shoulder:  Mild effusion in the bicipital tendon sheath  Overlying effusion and ligament thickness of the subscapularis  Subacromial bursitis    Summary: degenerative meniscus tear of right knee and left shoulder effusion   PATIENT SURVEYS:  LEFS 70 / 80 = 87.5 % Quick Dash 25.0 / 100 = 25.0 %  COGNITION: Overall cognitive status: Within functional limits for tasks assessed     SENSATION: WFL  EDEMA:  R knee previously  swelling, but no resolved  MUSCLE LENGTH: Hamstrings: mild tight R>L ITB: mod tight R>L Piriformis: WFL Hip flexors: mild tight R>L Quads: mild/mod tight R>L  POSTURE:  rounded shoulders and forward head  PALPATION: Mildly TTP in L pecs, anterior deltoid. Mildly restricted R patellar mobility.  UPPER EXTREMITY ROM:   Active ROM Right eval Left Eval   Shoulder flexion 150 143 *  Shoulder extension 44 39  Shoulder abduction 161 150 *  Shoulder adduction     Shoulder internal rotation FIR T10 FIR T10 *  Shoulder external rotation FER T2 FER T2 *  Elbow flexion    Elbow extension    Wrist flexion    Wrist extension    Wrist ulnar deviation    Wrist radial deviation    Wrist pronation    Wrist supination    (Blank rows = not tested) * = pain  UPPER EXTREMITY MMT:  MMT Right eval Left eval  Shoulder flexion 5 4- *  Shoulder extension 5 4+  Shoulder abduction 5 4- *  Shoulder adduction    Shoulder internal rotation 5 4 *  Shoulder external rotation 5 4  Middle trapezius    Lower trapezius    Elbow flexion    Elbow extension    Wrist flexion    Wrist extension    Wrist ulnar deviation    Wrist radial deviation    Wrist pronation    Wrist supination    Grip strength (lbs)    (Blank rows = not tested) * = pain  SHOULDER SPECIAL TESTS: Impingement tests: Neer impingement test: positive  and Hawkins/Kennedy impingement test: positive  on L shoulder  LOWER EXTREMITY ROM:  Active ROM Right eval Left eval  Hip flexion    Hip extension    Hip abduction    Hip adduction    Hip internal rotation    Hip external rotation    Knee flexion 131 141  Knee extension 0 0  Ankle dorsiflexion    Ankle plantarflexion    Ankle inversion    Ankle eversion     (Blank rows = not tested)  LOWER EXTREMITY MMT:  MMT Right eval Left eval  Hip flexion 4+ 4+  Hip extension 4 4  Hip abduction 4 4+  Hip adduction 4+ 4+  Hip internal rotation 5 5  Hip external rotation 4+ 4+  Knee flexion 5 5  Knee extension 5 5  Ankle dorsiflexion 5 5  Ankle plantarflexion    Ankle inversion    Ankle eversion     (Blank rows = not tested)  LOWER EXTREMITY SPECIAL TESTS:  Hip special tests: Ober's test: positive  Knee special tests: McMurray's test: negative   TODAY'S TREATMENT:  02/11/23; Possible Re-Assessment? THERAPEUTIC EXERCISE: to improve flexibility, strength and mobility.  Verbal and tactile cues throughout for technique. UBE -  L3.0 x 6 min (3' each fwd & back)  Standing GTB scap retraction + B shoulder ER with back against doorframe 10 x 5" Standing GTB scap retraction + B shoulder horiz ABD with back against doorframe 10 x 5" Standing GTB scap retraction + B shoulder horiz ABD diagonals with back against doorframe 10 x 5" Serratus push-ups x 10 Serratus roll-ups on 6" FR with looped RTB at wrists x 10 Serratus slides with looped RTB at wrists x 10 Serratus clocks on wall with looped RTB at wrists x 10 - pt noting mild discomfort Standing GTB B scap retraction + shoulder extension 10 x 3" -  VC to avoid shoulder shrug   Body Blade    01/28/23 THERAPEUTIC EXERCISE: to improve flexibility, strength and mobility.  Verbal and tactile cues throughout for technique. UBE - L3.0 x 6 min (3' each fwd & back) Prone over green Pball from knees on Airex pad - I's, T's, Y's and W's x 10 each - cues for thoracic extension & cervical retraction Standing GTB scap retraction + B shoulder ER with back against doorframe 10 x 5" Standing GTB scap retraction + B shoulder horiz ABD with back against doorframe 10 x 5" Standing GTB scap retraction + B shoulder horiz ABD diagonals with back against doorframe 10 x 5" Serratus push-ups x 10 Serratus roll-ups on 6" FR with looped RTB at wrists x 10 Serratus slides with looped RTB at wrists x 10 Serratus clocks on wall with looped RTB at wrists x 10 - pt noting mild discomfort Standing GTB B scap retraction + shoulder extension 10 x 3" - VC to avoid shoulder shrug   01/21/23 THERAPEUTIC EXERCISE: to improve flexibility, strength and mobility.  Verbal and tactile cues throughout for technique. Pulleys: 2 min flexion, 2 min scaption Standing GTB ER + scap retraction at doorframe 10x5" Standing horiz ABD GTB at doorframe 10x3" BATCA row 35# 2x10 high grips S/L L shoulder abduction 2# x 10 - mild increase in pain S/L L shld ER 2#- very fatigued in shoulder  MANUAL THERAPY: To promote  normalized muscle tension, improved flexibility, and reduced pain.  STM to L supraspinatus and UT   01/14/23 THERAPEUTIC EXERCISE: to improve flexibility, strength and mobility.  Verbal and tactile cues throughout for technique. UBE - L2.0 x 6 min (3' each fwd & back) Low & high doorway pec stretches 3 x 30" - deferred mid level stretch d/t increased pain with starting position Standing GTB B scap retraction + shoulder ER 10 x 3" Standing GTB B scap retraction + shoulder extension 10 x 3" - VC & TC to increase scap retraction Standing GTB B scap retraction + shoulder horiz ABD at ~60 ABD 10 x 3", back against door frame for tactile reminder for scap  (increased pain at 90 ABD) Standing GTB B scap retraction + alt shoulder horiz ABD diagonals ABD 10 x 3" S/L L shoulder abduction thumb up x 10  Seated cervical retraction 10 x 5" Seated R/L UT stretch 2 x 30" Seated R/L LS stretch 2 x 30"  MANUAL THERAPY: To promote normalized muscle tension, improved flexibility, and reduced pain. STM and manual TPR to L pecs and supraspinatus, B UT and LS   PATIENT EDUCATION:  Education details: HEP update - trap and serratus strengthening Person educated: Patient Education method: Explanation, Demonstration, and Handouts Education comprehension: verbalized understanding, returned demonstration, and needs further education  HOME EXERCISE PROGRAM: Access Code: [email or web address] URL: [email or web address] Date: 01/28/2023 Prepared by: Annie Paras  Exercises - Shoulder External Rotation and Scapular Retraction with Resistance  - 1 x daily - 7 x weekly - 2 sets - 10 reps - Shoulder Extension with Anchored Resistance  - 1 x daily - 7 x weekly - 2 sets - 10 reps - Doorway Pec Stretch at 60 Degrees Abduction with Arm Straight  - 1 x daily - 7 x weekly - 3 sets - 30 sec hold - Sidelying Shoulder Abduction Palm Forward  - 1 x daily - 7 x weekly - 2 sets - 10 reps - Seated Cervical Retraction  - 2 x  daily - 7 x weekly -  2 sets - 10 reps - 3-5 sec hold - Seated Gentle Upper Trapezius Stretch  - 2 x daily - 7 x weekly - 3 reps - 30 sec hold - Gentle Levator Scapulae Stretch  - 2 x daily - 7 x weekly - 3 reps - 30 sec hold - Serratus Forearm Push Up Plus at Wall with Elbows  - 1 x daily - 3-4 x weekly - 2 sets - 10 reps - 3 sec hold - Serratus Activation at Wall with Foam Roll and Resistance Band  - 1 x daily - 3-4 x weekly - 2 sets - 10 reps - 3 sec hold - Standing Shoulder Horizontal Abduction with Resistance  - 1 x daily - 3-4 x weekly - 2 sets - 10 reps - 3 sec hold - Standing Shoulder Diagonal Horizontal Abduction 60/120 Degrees with Resistance  - 1 x daily - 3-4 x weekly - 2 sets - 10 reps - 3 sec hold   ASSESSMENT:  CLINICAL IMPRESSION: ***   Rosamaria Lints "Liliane Channel" reports his L shoulder pain has been gradually fading with less stress in lateral neck as well - 75% reduction in pain reported - LTG #2. Continued scapular strengthening adding gravity resisted trap activation as well as serratus strengthening due to continued L scapular winging noted. Good tolerance for most exercises but some mild discomfort noted with serratus wall clocks. HEP progressed to reflect strengthening progression. Evin "Liliane Channel" will continue to benefit from skilled PT to address ongoing deficits to improve mobility and activity tolerance with decreased pain interference.   OBJECTIVE IMPAIRMENTS: decreased activity tolerance, decreased knowledge of condition, decreased mobility, difficulty walking, decreased ROM, decreased strength, increased fascial restrictions, impaired perceived functional ability, increased muscle spasms, impaired flexibility, impaired UE functional use, improper body mechanics, postural dysfunction, and pain.   ACTIVITY LIMITATIONS: carrying, lifting, bending, standing, squatting, stairs, transfers, bathing, dressing, reach over head, locomotion level, and caring for others  PARTICIPATION  LIMITATIONS: meal prep, cleaning, laundry, driving, shopping, community activity, occupation, and yard work  PERSONAL FACTORS: Fitness, Past/current experiences, Profession, Time since onset of injury/illness/exacerbation, and 3+ comorbidities: DM, HTN, OA, prostate cancer, OSA - CPAP, GERD, R CTR, recent surgery to remove cyst from back with post-op infection  are also affecting patient's functional outcome.   REHAB POTENTIAL: Excellent  CLINICAL DECISION MAKING: Stable/uncomplicated  EVALUATION COMPLEXITY: Low   GOALS: Goals reviewed with patient? Yes  SHORT TERM GOALS: Target date: 01/28/2023  Patient will be independent with initial HEP. Baseline:  Goal status: MET  01/21/23  LONG TERM GOALS: Target date: 02/25/2023  Patient will be independent with advanced/ongoing HEP to improve outcomes and carryover.  Baseline:  Goal status: IN PROGRESS  2.  Patient will report at least 75% improvement in L shoulder pain to improve QOL. Baseline:  Goal status: MET  01/28/23 - Pt reports 75% reduction in pain allowing for better movement tolerance   3.  Patient to improve L shoulder AROM to WFL/WNL without pain provocation. Baseline:  Goal status: IN PROGRESS  4.  Patient will demonstrate improved L shoulder strength to >/= 4+/5 for functional UE use. Baseline:  Goal status: IN PROGRESS  5.  Patient will demonstrate improved B proximal LE strength to >/= 4+/5 for improved stability and ease of mobility. Baseline:  Goal status: IN PROGRESS  6.  Patient to report ability to perform ADLs, household, and work-related tasks without limitation due to L shoulder pain, LOM or weakness.  Baseline:  Goal status: IN PROGRESS  7.  Patient will be able to squat without limitation due to R knee pain or LOM to allow him to complete his job-related tasks.  Baseline:  Goal status: IN PROGRESS  8.  Patient will report </= 15% on QuickDASH to demonstrate improved functional ability. Baseline: 25.0 /  100 = 25.0% Goal status: IN PROGRESS   PLAN:  PT FREQUENCY: 1-2x/week  PT DURATION: 8 weeks  PLANNED INTERVENTIONS: Therapeutic exercises, Therapeutic activity, Neuromuscular re-education, Balance training, Gait training, Patient/Family education, Self Care, Joint mobilization, Stair training, DME instructions, Aquatic Therapy, Dry Needling, Electrical stimulation, Cryotherapy, Moist heat, Taping, Vasopneumatic device, Ultrasound, Ionotophoresis 75m/ml Dexamethasone, Manual therapy, and Re-evaluation  PLAN FOR NEXT SESSION: ***  MD PN for 02/04/23 - reassess shoulder ROM & strength, LTGs; postural awareness training with anterior stretching and scapular strengthening/stabilization; proximal LE flexibility/stretching; core & proximal LE strengthening   Dayden Viverette Romero-Perozo, Student-PT 02/08/2023, 9:50 AM

## 2023-02-11 ENCOUNTER — Ambulatory Visit: Payer: 59 | Admitting: Internal Medicine

## 2023-02-11 ENCOUNTER — Ambulatory Visit: Payer: 59 | Admitting: Physical Therapy

## 2023-02-11 NOTE — Progress Notes (Deleted)
Name: Jerry Griffith  MRN/ DOB: [id number], 11-05-1961   Age/ Sex: 62 y.o., male    PCP: Elise Benne   Reason for Endocrinology Evaluation: Type 2 Diabetes Mellitus     Date of Initial Endocrinology Visit: 02/11/2023     PATIENT IDENTIFIER: [name] is a 62 y.o. male with a past medical history of DM, HTN, OSA. The patient presented for initial endocrinology clinic visit on 02/11/2023 for consultative assistance with his diabetes management.    HPI: [name] was    Diagnosed with DM *** Prior Medications tried/Intolerance: *** Currently checking blood sugars *** x / day,  before breakfast and ***.  Hypoglycemia episodes : ***               Symptoms: ***                 Frequency: ***/  Hemoglobin A1c has ranged from 7.1% in 10/2022, peaking at 11.8% in 07/2022. Patient required assistance for hypoglycemia:  Patient has required hospitalization within the last 1 year from hyper or hypoglycemia: DKA 10/2022  In terms of diet, the patient ***   HOME DIABETES REGIMEN: Levemir NovoLog   Statin: yes ACE-I/ARB: yes Prior Diabetic Education: {Yes/No:11203}   METER DOWNLOAD SUMMARY: Date range evaluated: *** Fingerstick Blood Glucose Tests = *** Average Number Tests/Day = *** Overall Mean FS Glucose = *** Standard Deviation = ***  BG Ranges: Low = *** High = ***   Hypoglycemic Events/30 Days: BG < 50 = *** Episodes of symptomatic severe hypoglycemia = ***   DIABETIC COMPLICATIONS: Microvascular complications:  *** Denies: CKD Last eye exam: Completed 08/13/2022  Macrovascular complications:  *** Denies: CAD, PVD, CVA   PAST HISTORY: Past Medical History:  Past Medical History:  Diagnosis Date   Arthritis    Cancer ([place]) 2/14   prostate   Diabetes mellitus without complication ([place])    Headache    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   Sinus congestion    Sleep apnea    cpap   Past Surgical  History:  Past Surgical History:  Procedure Laterality Date   CARPAL TUNNEL RELEASE Right 04/05/2016   Procedure: RIGHT CARPAL TUNNEL RELEASE;  Surgeon: Leanora Cover, MD;  Location: [facility];  Service: Orthopedics;  Laterality: Right;   PROSTATE SURGERY  2/14   TONSILLECTOMY      Social History:  reports that he has been smoking cigars. He has never used smokeless tobacco. He reports that he does not drink alcohol and does not use drugs. Family History:  Family History  Problem Relation Age of Onset   Heart disease Neg Hx      HOME MEDICATIONS: Allergies as of 02/11/2023   No Known Allergies      Medication List        Accurate as of February 11, 2023  7:06 AM. If you have any questions, ask your nurse or doctor.          atorvastatin 20 MG tablet Commonly known as: LIPITOR Take 20 mg by mouth daily.   blood glucose meter kit and supplies Kit Dispense based on patient and insurance preference. Use up to four times daily as directed.   chlorthalidone 25 MG tablet Commonly known as: HYGROTON TAKE ONE TABLET BY MOUTH ONE TIME DAILY   cholecalciferol 25 MCG (1000 UNIT) tablet Commonly known as: VITAMIN D3 Take 1,000 Units by mouth daily.   Comirnaty syringe  Generic drug: COVID-19 mRNA vaccine 2023-2024 Inject into the muscle.   fluticasone 50 MCG/ACT nasal spray Commonly known as: FLONASE Place 1 spray into both nostrils daily as needed for allergies.   insulin aspart 100 UNIT/ML FlexPen Commonly known as: NOVOLOG Inject 8 Units into the skin 3 (three) times daily with meals.   l-methylfolate-B6-B12 3-35-2 MG Tabs tablet Commonly known as: METANX Take 1 tablet by mouth daily.   Levemir FlexPen 100 UNIT/ML FlexPen Generic drug: insulin detemir Inject 30 Units into the skin at bedtime.   losartan 50 MG tablet Commonly known as: COZAAR TAKE ONE TABLET BY MOUTH ONE TIME DAILY   multivitamin capsule Take 1 capsule by mouth daily.   mupirocin ointment 2  % Commonly known as: BACTROBAN Apply 1 Application topically 2 (two) times daily.   Omega-3 1000 MG Caps Take 1 g by mouth daily.   omeprazole 40 MG capsule Commonly known as: PRILOSEC Take 40 mg by mouth daily.   Pen Needles 3/16" 31G X 5 MM Misc 1 each by Does not apply route 4 (four) times daily.   vitamin B-12 100 MCG tablet Commonly known as: CYANOCOBALAMIN Take 100 mcg by mouth daily.   vitamin C 250 MG tablet Commonly known as: ASCORBIC ACID Take 250 mg by mouth daily.         ALLERGIES: No Known Allergies   REVIEW OF SYSTEMS: A comprehensive ROS was conducted with the patient and is negative except as per HPI and below:  ROS    OBJECTIVE:   VITAL SIGNS: There were no vitals taken for this visit.   PHYSICAL EXAM:  General: Pt appears well and is in NAD  Neck: General: Supple without adenopathy or carotid bruits. Thyroid: Thyroid size normal.  No goiter or nodules appreciated.   Lungs: Clear with good BS bilat with no rales, rhonchi, or wheezes  Heart: RRR   Abdomen:  soft, nontender  Extremities:  Lower extremities - No pretibial edema. No lesions.  Skin: Normal texture and temperature to palpation. No rash noted.  Neuro: MS is good with appropriate affect, pt is alert and Ox3    DM foot exam:    DATA REVIEWED:  Lab Results  Component Value Date   HGBA1C 7.1 (H) 11/12/2022   HGBA1C 11.8 (H) 08/07/2022   Lab Results  Component Value Date   CREATININE 0.72 11/12/2022   No results found for: "MICRALBCREAT"  No results found for: "CHOL", "HDL", "LDLCALC", "LDLDIRECT", "TRIG", "CHOLHDL"      ASSESSMENT / PLAN / RECOMMENDATIONS:   1) Type *** Diabetes Mellitus, ***controlled, With*** complications - Most recent A1c of *** %. Goal A1c < *** %.  ***  Plan: GENERAL: ***  MEDICATIONS: ***  EDUCATION / INSTRUCTIONS: BG monitoring instructions: Patient is instructed to check his blood sugars *** times a day, ***. Call [place]  Endocrinology clinic if: BG persistently < 70  I reviewed the Rule of 15 for the treatment of hypoglycemia in detail with the patient. Literature supplied.   2) Diabetic complications:  Eye: Does *** have known diabetic retinopathy.  Neuro/ Feet: Does *** have known diabetic peripheral neuropathy. Renal: Patient does *** have known baseline CKD. He is *** on an ACEI/ARB at present.  3) Lipids: Patient is *** on a statin.    4) Hypertension: ***  at goal of < 140/90 mmHg.       Signed electronically by: Mack Guise, MD  [facility] Endocrinology  [facility] [place]  Dolores Patty [address] [place] [address] Phone: [phone] FAX: [phone]   CC: Elise Benne [id number] [place] [address] Phone: [phone]  Fax: [phone]    Return to Endocrinology clinic as below: Future Appointments  Date Time Provider [place]  02/11/2023  7:30 AM Ulmer Degen, Melanie Crazier, MD [facility] None  02/11/2023  8:45 AM Percival Spanish, PT [facility]  02/18/2023  8:45 AM Artist Pais, PTA [facility]  02/18/2023 10:10 AM Rosemarie Ax, MD [facility]  02/25/2023  8:45 AM Percival Spanish, PT [facility]

## 2023-02-18 ENCOUNTER — Ambulatory Visit: Payer: 59

## 2023-02-18 ENCOUNTER — Ambulatory Visit: Payer: 59 | Admitting: Family Medicine

## 2023-02-25 ENCOUNTER — Ambulatory Visit: Payer: 59 | Attending: Family Medicine | Admitting: Physical Therapy

## 2023-02-25 ENCOUNTER — Encounter: Payer: Self-pay | Admitting: Physical Therapy

## 2023-02-25 DIAGNOSIS — M25561 Pain in right knee: Secondary | ICD-10-CM | POA: Diagnosis present

## 2023-02-25 DIAGNOSIS — R293 Abnormal posture: Secondary | ICD-10-CM | POA: Insufficient documentation

## 2023-02-25 DIAGNOSIS — M6281 Muscle weakness (generalized): Secondary | ICD-10-CM | POA: Diagnosis present

## 2023-02-25 DIAGNOSIS — M25512 Pain in left shoulder: Secondary | ICD-10-CM | POA: Diagnosis present

## 2023-02-25 DIAGNOSIS — M25612 Stiffness of left shoulder, not elsewhere classified: Secondary | ICD-10-CM | POA: Insufficient documentation

## 2023-02-25 NOTE — Therapy (Signed)
OUTPATIENT PHYSICAL THERAPY TREATMENT & DISCHARGE SUMMARY   Patient Name: Jerry Griffith MRN: [id number] DOB:January 26, 1961, 62 y.o., male Today's Date: 02/25/2023  END OF SESSION:  PT End of Session - 02/25/23 0841     Visit Number 6    Date for PT Re-Evaluation 02/25/23    Authorization Type [employer] - VL: 90    PT Start Time 0841    PT Stop Time 0916    PT Time Calculation (min) 35 min    Activity Tolerance Patient tolerated treatment well    Behavior During Therapy [facility] for tasks assessed/performed               Past Medical History:  Diagnosis Date   Arthritis    Cancer ([place]) 2/14   prostate   Diabetes mellitus without complication ([place])    Headache    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   Sinus congestion    Sleep apnea    cpap   Past Surgical History:  Procedure Laterality Date   CARPAL TUNNEL RELEASE Right 04/05/2016   Procedure: RIGHT CARPAL TUNNEL RELEASE;  Surgeon: Leanora Cover, MD;  Location: [place];  Service: Orthopedics;  Laterality: Right;   PROSTATE SURGERY  2/14   TONSILLECTOMY     Patient Active Problem List   Diagnosis Date Noted   Degenerative tear of medial meniscus of right knee 11/19/2022   Effusion of left shoulder 11/19/2022   GERD without esophagitis 08/07/2022   Mixed diabetic hyperlipidemia associated with type 2 diabetes mellitus ([place]) 08/07/2022   Essential hypertension 08/07/2022   Cellulitis of mid back region 08/07/2022   OSA on CPAP 08/07/2022   Uncontrolled type 2 diabetes mellitus with ketoacidosis without coma, without long-term current use of insulin ([place]) 08/06/2022   Malignant neoplasm of prostate ([place]) 08/20/2014    PCP: Mackie Pai, PA-C   REFERRING PROVIDER: Rosemarie Ax, MD   REFERRING DIAG:  [phone] (ICD-10-CM) - Degenerative tear of medial meniscus of right knee  M25.411 (ICD-10-CM) - Shoulder effusion, right   THERAPY DIAG:  Acute pain of left shoulder  Stiffness of left  shoulder, not elsewhere classified  Abnormal posture  Muscle weakness (generalized)  Acute pain of right knee  RATIONALE FOR EVALUATION AND TREATMENT: Rehabilitation  ONSET DATE: ~3 months  NEXT MD VISIT: PRN   SUBJECTIVE:   SUBJECTIVE STATEMENT: Pt reports he "has the lost the pain" in his L shoulder - no pain for the past few weeks. No further trouble with his R knee either.  PAIN: Are you having pain? No  PERTINENT HISTORY: DM, HTN, OA, prostate cancer, OSA - CPAP, GERD, R CTR, recent surgery to remove cyst from back with post-op infection  PRECAUTIONS: None  WEIGHT BEARING RESTRICTIONS: No  FALLS:  Has patient fallen in last 6 months? 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 Make sure I'm stable and my knees don't give out."   OBJECTIVE:   DIAGNOSTIC FINDINGS:  11/19/22 - Limited ultrasound: Right knee, left shoulder:   Right knee:  Trace effusion  Normal quadricept and patellar tendon  Medial meniscus degenerative changes No changes in lateral meniscus    Left shoulder:  Mild effusion in the bicipital tendon sheath  Overlying effusion and ligament thickness of the subscapularis  Subacromial bursitis    Summary: degenerative meniscus tear of right knee and left shoulder effusion   PATIENT SURVEYS:  LEFS 70 / 80 = 87.5 % Quick Dash 25.0 / 100 = 25.0 %  COGNITION: Overall cognitive status: Within functional limits for tasks assessed     SENSATION: WFL  EDEMA:  R knee previously swelling, but no resolved  MUSCLE  LENGTH: Hamstrings: mild tight R>L ITB: mod tight R>L Piriformis: WFL Hip flexors: mild tight R>L Quads: mild/mod tight R>L  POSTURE:  rounded shoulders and forward head  PALPATION: Mildly TTP in L pecs, anterior deltoid. Mildly restricted R patellar mobility.  UPPER EXTREMITY ROM:   Active ROM Right eval Left Eval  Right 02/25/23 Left 02/25/23  Shoulder flexion 150 143 * 155 160  Shoulder extension 44 39 47 45  Shoulder abduction 161 150 * 170 170  Shoulder adduction      Shoulder internal rotation FIR T10 FIR T10 * FIR T10 FIR T10  Shoulder external rotation FER T2 FER T2 * FER T2 FER T2  Elbow flexion      Elbow extension      Wrist flexion      Wrist extension      Wrist ulnar deviation      Wrist radial deviation      Wrist pronation      Wrist supination      (Blank rows = not tested) * = pain  UPPER EXTREMITY MMT:  MMT Right eval Left eval Right 02/25/23 Left 02/25/23  Shoulder flexion 5 4- * 5 5  Shoulder extension 5 4+ 5 5  Shoulder abduction 5 4- * 5 5  Shoulder adduction      Shoulder internal rotation 5 4 * 5 5  Shoulder external rotation '5 4 5 5  '$ Middle trapezius      Lower trapezius      Elbow flexion      Elbow extension      Wrist flexion      Wrist extension      Wrist ulnar deviation      Wrist radial deviation      Wrist pronation      Wrist supination      Grip strength (lbs)      (Blank rows = not tested) * = pain  SHOULDER SPECIAL TESTS: Impingement tests: Neer impingement test: positive  and Hawkins/Kennedy impingement test: positive  on L shoulder  LOWER EXTREMITY ROM:  Active ROM Right eval Left eval  Hip flexion    Hip extension    Hip abduction    Hip adduction    Hip internal rotation    Hip external rotation    Knee flexion 131 141  Knee extension 0 0  Ankle dorsiflexion    Ankle plantarflexion    Ankle inversion    Ankle eversion     (Blank rows = not tested)  LOWER EXTREMITY MMT:  MMT Right eval Left eval  Right 02/25/23 Left 02/25/23  Hip flexion 4+ 4+ 5 5  Hip extension 4 4 4+ 4+  Hip abduction 4 4+ 5 5  Hip adduction 4+ 4+ 5 5  Hip internal rotation '5 5 5 5  '$ Hip external rotation 4+ 4+ 5 5  Knee flexion 5 5  Knee extension 5 5    Ankle dorsiflexion 5 5    Ankle plantarflexion      Ankle inversion      Ankle eversion       (Blank rows = not tested)  LOWER EXTREMITY SPECIAL TESTS:  Hip special tests: Ober's test: positive  Knee special tests: McMurray's test: negative   TODAY'S TREATMENT:  02/25/23 THERAPEUTIC EXERCISE: to improve flexibility, strength and mobility.  Verbal and tactile cues throughout for technique. UBE - L3.0 x 6 min (3' each fwd & back) Standing blue TB scap retraction + B shoulder ER with back against doorframe 10 x 5" Standing blue TB scap retraction + B shoulder horiz ABD with back against doorframe 10 x 5" Standing blue TB scap retraction + B shoulder horiz ABD diagonals with back against doorframe 10 x 5" Standing blue TB scap retraction + shoulder extension 10 x 5"   THERAPEUTIC ACTIVITIES: Shoulder ROM UE/LE MMT QuickDASH: 0.0 / 100 = 0.0 %   01/28/23 THERAPEUTIC EXERCISE: to improve flexibility, strength and mobility.  Verbal and tactile cues throughout for technique. UBE - L3.0 x 6 min (3' each fwd & back) Prone over green Pball from knees on Airex pad - I's, T's, Y's and W's x 10 each - cues for thoracic extension & cervical retraction Standing GTB scap retraction + B shoulder ER with back against doorframe 10 x 5" Standing GTB scap retraction + B shoulder horiz ABD with back against doorframe 10 x 5" Standing GTB scap retraction + B shoulder horiz ABD diagonals with back against doorframe 10 x 5" Serratus push-ups x 10 Serratus roll-ups on 6" FR with looped RTB at wrists x 10 Serratus slides with looped RTB at wrists x 10 Serratus clocks on wall with looped RTB at wrists x 10 - pt noting mild discomfort Standing GTB B scap retraction + shoulder  extension 10 x 3" - VC to avoid shoulder shrug   01/21/23 THERAPEUTIC EXERCISE: to improve flexibility, strength and mobility.  Verbal and tactile cues throughout for technique. Pulleys: 2 min flexion, 2 min scaption Standing GTB ER + scap retraction at doorframe 10x5" Standing horiz ABD GTB at doorframe 10x3" BATCA row 35# 2x10 high grips S/L L shoulder abduction 2# x 10 - mild increase in pain S/L L shld ER 2#- very fatigued in shoulder  MANUAL THERAPY: To promote normalized muscle tension, improved flexibility, and reduced pain.  STM to L supraspinatus and UT   PATIENT EDUCATION:  Education details: HEP update - trap and serratus strengthening Person educated: Patient Education method: Explanation, Demonstration, and Handouts Education comprehension: verbalized understanding, returned demonstration, and needs further education  HOME EXERCISE PROGRAM: Access Code: [email or web address] URL: [email or web address] Date: 01/28/2023 Prepared by: Annie Paras  Exercises - Shoulder External Rotation and Scapular Retraction with Resistance  - 1 x daily - 7 x weekly - 2 sets - 10 reps - Shoulder Extension with Anchored Resistance  - 1 x daily - 7 x weekly - 2 sets - 10 reps - Doorway Pec Stretch at 60 Degrees Abduction with Arm Straight  - 1 x daily - 7 x weekly - 3 sets - 30 sec hold - Sidelying Shoulder Abduction Palm Forward  - 1 x daily - 7 x weekly - 2 sets - 10 reps - Seated Cervical Retraction  - 2 x daily - 7 x weekly - 2 sets - 10 reps - 3-5 sec hold - Seated Gentle Upper Trapezius Stretch  - 2 x daily -  7 x weekly - 3 reps - 30 sec hold - Gentle Levator Scapulae Stretch  - 2 x daily - 7 x weekly - 3 reps - 30 sec hold - Serratus Forearm Push Up Plus at Wall with Elbows  - 1 x daily - 3-4 x weekly - 2 sets - 10 reps - 3 sec hold - Serratus Activation at Wall with Foam Roll and Resistance Band  - 1 x daily - 3-4 x weekly - 2 sets - 10 reps - 3 sec hold - Standing Shoulder  Horizontal Abduction with Resistance  - 1 x daily - 3-4 x weekly - 2 sets - 10 reps - 3 sec hold - Standing Shoulder Diagonal Horizontal Abduction 60/120 Degrees with Resistance  - 1 x daily - 3-4 x weekly - 2 sets - 10 reps - 3 sec hold   ASSESSMENT:  CLINICAL IMPRESSION: Jerry Griffith" returns to PT for the 1st time in ~1 month due to illness and a death in the family. He reports his L shoulder pain has resolved with no pain over the past few weeks and his R knee pain has not returned. He feels that the HEP has really helped - he is able to perform good return demonstration and progress resistance to blue TB w/o problems. B shoulder ROM now symmetrical and WNL. B UE and LE strength now grossly 5/5 with only exception being B hip extension at 4+/5. He denies any further issues and QuickDASH indicated 0% disability. All goals now met and Jerry Griffith feels ready to transition to his HEP, therefore will proceed with discharge from physical therapy for this episode.  OBJECTIVE IMPAIRMENTS: decreased activity tolerance, decreased knowledge of condition, decreased mobility, difficulty walking, decreased ROM, decreased strength, increased fascial restrictions, impaired perceived functional ability, increased muscle spasms, impaired flexibility, impaired UE functional use, improper body mechanics, postural dysfunction, and pain.   ACTIVITY LIMITATIONS: carrying, lifting, bending, standing, squatting, stairs, transfers, bathing, dressing, reach over head, locomotion level, and caring for others  PARTICIPATION LIMITATIONS: meal prep, cleaning, laundry, driving, shopping, community activity, occupation, and yard work  PERSONAL FACTORS: Fitness, Past/current experiences, Profession, Time since onset of injury/illness/exacerbation, and 3+ comorbidities: DM, HTN, OA, prostate cancer, OSA - CPAP, GERD, R CTR, recent surgery to remove cyst from back with post-op infection  are also affecting patient's functional outcome.    REHAB POTENTIAL: Excellent  CLINICAL DECISION MAKING: Stable/uncomplicated  EVALUATION COMPLEXITY: Low   GOALS: Goals reviewed with patient? Yes  SHORT TERM GOALS: Target date: 01/28/2023  Patient will be independent with initial HEP. Baseline:  Goal status: MET  01/21/23  LONG TERM GOALS: Target date: 02/25/2023  Patient will be independent with advanced/ongoing HEP to improve outcomes and carryover.  Baseline:  Goal status: MET  02/25/23  2.  Patient will report at least 75% improvement in L shoulder pain to improve QOL. Baseline:  Goal status: MET  01/28/23 - Pt reports 75% reduction in pain allowing for better movement tolerance; 02/25/23 - 100% improvement   3.  Patient to improve L shoulder AROM to WFL/WNL without pain provocation. Baseline:  Goal status: MET  02/25/23  4.  Patient will demonstrate improved L shoulder strength to >/= 4+/5 for functional UE use. Baseline:  Goal status: MET  02/25/23  5.  Patient will demonstrate improved B proximal LE strength to >/= 4+/5 for improved stability and ease of mobility. Baseline:  Goal status: MET  02/25/23  6.  Patient to report ability to perform ADLs, household, and  work-related tasks without limitation due to L shoulder pain, LOM or weakness.  Baseline:  Goal status: MET  02/25/23  7. Patient will be able to squat without limitation due to R knee pain or LOM to allow him to complete his job-related tasks.  Baseline:  Goal status: MET  02/25/23  8.  Patient will report </= 15% on QuickDASH to demonstrate improved functional ability. Baseline: 25.0 / 100 = 25.0% Goal status: MET  02/25/23 - 0.0 / 100 = 0.0 %   PLAN:  PT FREQUENCY: 1-2x/week  PT DURATION: 8 weeks  PLANNED INTERVENTIONS: Therapeutic exercises, Therapeutic activity, Neuromuscular re-education, Balance training, Gait training, Patient/Family education, Self Care, Joint mobilization, Stair training, DME instructions, Aquatic Therapy, Dry Needling, Electrical  stimulation, Cryotherapy, Moist heat, Taping, Vasopneumatic device, Ultrasound, Ionotophoresis '4mg'$ /ml Dexamethasone, Manual therapy, and Re-evaluation  PLAN FOR NEXT SESSION: transition to HEP + D/C from PT   [place]  Visits from Start of Care: 6  Current functional level related to goals / functional outcomes:   Refer to above clinical impression and goal assessment.   Remaining deficits:   As above. No current deficits identified.   Education / Equipment:   HEP   Patient agrees to discharge. Patient goals were met. Patient is being discharged due to meeting the stated rehab goals.   Percival Spanish, PT 02/25/2023, 9:17 AM

## 2023-02-26 ENCOUNTER — Other Ambulatory Visit: Payer: Self-pay | Admitting: Medical

## 2023-03-13 ENCOUNTER — Other Ambulatory Visit: Payer: Self-pay | Admitting: Medical

## 2023-03-28 ENCOUNTER — Other Ambulatory Visit: Payer: Self-pay | Admitting: Medical

## 2023-04-03 ENCOUNTER — Telehealth: Payer: Self-pay | Admitting: Family Medicine

## 2023-04-03 NOTE — Telephone Encounter (Signed)
Pt's [employer] nurse case manager called to give her contact information in case any outside referral or Continuity of care needed pt has a assigned team to assist with medical care :  Valetta Close Case Mgr  p#) [phone]  fx# [phone].    Call her if needed

## 2023-04-08 ENCOUNTER — Encounter: Payer: Self-pay | Admitting: *Deleted

## 2023-04-08 ENCOUNTER — Ambulatory Visit (HOSPITAL_BASED_OUTPATIENT_CLINIC_OR_DEPARTMENT_OTHER)
Admission: RE | Admit: 2023-04-08 | Discharge: 2023-04-08 | Disposition: A | Discharge status: Home / Self Care | Payer: 59 | Source: Ambulatory Visit | Attending: Medical | Admitting: Medical

## 2023-04-08 ENCOUNTER — Ambulatory Visit: Payer: 59 | Admitting: Medical

## 2023-04-08 VITALS — BP 122/70 | HR 98 | Resp 18 | Ht 68.0 in | Wt 270.0 lb

## 2023-04-08 DIAGNOSIS — E785 Hyperlipidemia, unspecified: Secondary | ICD-10-CM | POA: Diagnosis not present

## 2023-04-08 DIAGNOSIS — R0781 Pleurodynia: Secondary | ICD-10-CM

## 2023-04-08 DIAGNOSIS — E1169 Type 2 diabetes mellitus with other specified complication: Secondary | ICD-10-CM

## 2023-04-08 DIAGNOSIS — R079 Chest pain, unspecified: Secondary | ICD-10-CM

## 2023-04-08 DIAGNOSIS — M940 Chondrocostal junction syndrome [Tietze]: Secondary | ICD-10-CM | POA: Diagnosis present

## 2023-04-08 DIAGNOSIS — K219 Gastro-esophageal reflux disease without esophagitis: Secondary | ICD-10-CM

## 2023-04-08 DIAGNOSIS — E782 Mixed hyperlipidemia: Secondary | ICD-10-CM

## 2023-04-08 MED ORDER — CHLORTHALIDONE 25 MG PO TABS: 25.0000 mg | ORAL_TABLET | Freq: Every day | ORAL | 3 refills | Status: DC

## 2023-04-08 MED ORDER — L-METHYLFOLATE-B6-B12 3-35-2 MG PO TABS: 1.0000 | ORAL_TABLET | Freq: Every day | ORAL | 3 refills | Status: DC

## 2023-04-08 MED ORDER — LOSARTAN POTASSIUM 50 MG PO TABS: 50.0000 mg | ORAL_TABLET | Freq: Every day | ORAL | 3 refills | Status: DC

## 2023-04-08 MED ORDER — KETOROLAC TROMETHAMINE 60 MG/2ML IM SOLN
60.0000 mg | Freq: Once | INTRAMUSCULAR | Status: AC
Start: 2023-04-08 — End: 2023-04-08
  Administered 2023-04-08: 60 mg via INTRAMUSCULAR

## 2023-04-08 NOTE — Progress Notes (Signed)
Subjective:    Patient ID: Jerry Griffith, male    DOB: 08-Jun-1961, 62 y.o.   MRN: [id number]  HPI Pt states recent discomfort on rt side chest. States had pain last 3-4 days. Worst at onset but less now.   Pt states first felt on rt side on Friday. He was in bathroom and states pain present for 30 minutes. Pt states he belchied and felt some better. He mentioned 8 months ago had some pain on rt side as well.   Pt states Saturday and Sunday less discomfort but did not last as long. He mentioned feeling maybe congested.  No pain today as he described the other day but does not pain on palpatin during exam.  No mid sternal or left side chest pain.  Diabetes- last A1c was 7. Pt on levemir 30 uits daily and novolog sliding scale.  Appt with endocrinologist pending.     Review of Systems  Constitutional:  Negative for chills, fatigue and fever.  Respiratory:  Negative for cough, chest tightness and wheezing.   Cardiovascular:  Negative for palpitations.  Gastrointestinal:  Negative for abdominal pain, constipation, diarrhea and nausea.  Genitourinary:  Negative for dysuria, frequency and hematuria.  Musculoskeletal:        Costochondral junction pain. See hpi and exam. Negative EKG.  Neurological:  Negative for dizziness, speech difficulty, weakness, numbness and headaches.  Hematological:  Negative for adenopathy. Does not bruise/bleed easily.  Psychiatric/Behavioral:  Negative for behavioral problems and confusion.    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   Sinus congestion    Sleep apnea    cpap     Social History   Socioeconomic History   Marital status: Married    Spouse name: Not on file   Number of children: Not on file   Years of education: Not on file   Highest education level: Not on file   Occupational History   Not on file  Tobacco Use   Smoking status: Some Days    Types: Cigars   Smokeless tobacco: Never   Tobacco comments:    occ cigar  Substance and Sexual Activity   Alcohol use: No    Alcohol/week: 0.0 standard drinks of alcohol   Drug use: No   Sexual activity: Not on file  Other Topics Concern   Not on file  Social History Narrative   Not on file   Social Determinants of Health   Financial Resource Strain: Not on file  Food Insecurity: Not on file  Transportation Needs: Not on file  Physical Activity: Not on file  Stress: Not on file  Social Connections: Not on file  Intimate Partner Violence: Not on file    Past Surgical History:  Procedure Laterality Date   CARPAL TUNNEL RELEASE Right 04/05/2016   Procedure: RIGHT CARPAL TUNNEL RELEASE;  Surgeon: Betha Loa, MD;  Location: [facility];  Service: Orthopedics;  Laterality: Right;   PROSTATE SURGERY  2/14   TONSILLECTOMY      Family History  Problem Relation Age of Onset   Heart disease Neg Hx     No Known Allergies  Current Outpatient Medications on File Prior to Visit  Medication Sig Dispense Refill   atorvastatin (LIPITOR) 20 MG tablet Take 20 mg  by mouth daily.     blood glucose meter kit and supplies KIT Dispense based on patient and insurance preference. Use up to four times daily as directed. 1 each 0   cholecalciferol (VITAMIN D3) 25 MCG (1000 UNIT) tablet Take 1,000 Units by mouth daily.     COVID-19 mRNA vaccine 2023-2024 (COMIRNATY) syringe Inject into the muscle. 0.3 mL 0   fluticasone (FLONASE) 50 MCG/ACT nasal spray Place 1 spray into both nostrils daily as needed for allergies.     insulin aspart (NOVOLOG) 100 UNIT/ML FlexPen Inject 8 Units into the skin 3 (three) times daily with meals. 15 mL 0   insulin detemir (LEVEMIR FLEXPEN) 100 UNIT/ML FlexPen Inject 30 Units into the skin at bedtime. 15 mL 3   Insulin Pen Needle (PEN NEEDLES 3/16") 31G X 5 MM MISC 1 each by Does not apply  route 4 (four) times daily. 100 each 1   l-methylfolate-B6-B12 (METANX) 3-35-2 MG TABS tablet Take 1 tablet by mouth daily.     losartan (COZAAR) 50 MG tablet TAKE ONE TABLET BY MOUTH ONE TIME DAILY 30 tablet 0   Multiple Vitamin (MULTIVITAMIN) capsule Take 1 capsule by mouth daily.     mupirocin ointment (BACTROBAN) 2 % Apply 1 Application topically 2 (two) times daily. 22 g 0   Omega-3 1000 MG CAPS Take 1 g by mouth daily.     omeprazole (PRILOSEC) 40 MG capsule Take 40 mg by mouth daily.     vitamin B-12 (CYANOCOBALAMIN) 100 MCG tablet Take 100 mcg by mouth daily.     vitamin C (ASCORBIC ACID) 250 MG tablet Take 250 mg by mouth daily.     No current facility-administered medications on file prior to visit.    BP 122/70   Pulse 98   Resp 18   Ht  (1.727 m)   Wt 270 lb (122.5 kg)   SpO2 97%   BMI 41.05 kg/m        Objective:   Physical Exam  General Mental Status- Alert. General Appearance- Not in acute distress.   Skin General: Color- Normal Color. Moisture- Normal Moisture.  Neck Carotid Arteries- Normal color. Moisture- Normal Moisture. No carotid bruits. No JVD. No tracheal deviation  Chest and Lung Exam Auscultation: Breath Sounds:-Normal.  Cardiovascular Auscultation:Rythm- Regular. Murmurs & Other Heart Sounds:Auscultation of the heart reveals- No Murmurs.  Abdomen Inspection:-Inspeection Normal. Palpation/Percussion:Note:No mass. Palpation and Percussion of the abdomen reveal- Non Tender, Non Distended + BS, no rebound or guarding.   Neurologic Cranial Nerve exam:- CN III-XII intact(No nystagmus), symmetric smile. Strength:- 5/5 equal and symmetric strength both upper and lower extremities.   Lower ext- calfs symmetric. No edema. Negative homans signs bilaterally.    Anterior thorax- rt sided costochondral junction tenderness to palpation. Easily reproducible.    Assessment & Plan:   Patient Instructions   Chest pain. Very much  costochondritis like in that you have reproducible right-sided costochondral junction tenderness to palpation.  No mid chest pain and no left-sided chest pain.  With your medical history proceed with caution.  If you have any middle or left-sided chest pain then be seen in the emergency department.  Your EKG showed normal sinus rhythm.  Will get x-ray of chest to evaluate lung fields in the event of possible pleuritic pain.  On exam calf symmetric with no edema.  No pain behind knees on exam.  Therefore doubt PE.  Will give Toradol 60 mg IM injection today.  We palpate right side chest  wall to see if reproducible pain resolves with Toradol.  Give me an update tomorrow morning.  Can use Tylenol for pain and after update might advised low-dose ibuprofen.  - EKG 12-Lead - DG Chest 2 View; Future     Hyperlipidemia, unspecified hyperlipidemia type Follow lipid panel to be done later this week.  Then decide on what dose to refill your atorvastatin. - Lipid panel; Future - Comp Met (CMET); Future  Mixed diabetic hyperlipidemia associated with type 2 diabetes mellitus Continue low sugar diet and basal as well as mealtime insulin. - Hemoglobin A1c; Future - Comp Met (CMET); Future   Pleuritic pain Follow chest x-ray. - DG Chest 2 View; Future   GERD without esophagitis Continue proton pump inhibitor.  Follow-up date to be determined after lab, chest x-ray review and your update on how you are doing.    Esperanza Richters, PA-C

## 2023-04-08 NOTE — Patient Instructions (Addendum)
Chest pain. Very much costochondritis like in that you have reproducible right-sided costochondral junction tenderness to palpation.  No mid chest pain and no left-sided chest pain.  With your medical history proceed with caution.  If you have any middle or left-sided chest pain then be seen in the emergency department.  Your EKG showed normal sinus rhythm.  Will get x-ray of chest to evaluate lung fields in the event of possible pleuritic pain.  On exam calf symmetric with no edema.  No pain behind knees on exam.  Therefore doubt PE.  Will give Toradol 60 mg IM injection today.  We palpate right side chest wall to see if reproducible pain resolves with Toradol.  Give me an update tomorrow morning.  Can use Tylenol for pain and after update might advised low-dose ibuprofen.  - EKG 12-Lead - DG Chest 2 View; Future     Hyperlipidemia, unspecified hyperlipidemia type Follow lipid panel to be done later this week.  Then decide on what dose to refill your atorvastatin. - Lipid panel; Future - Comp Met (CMET); Future  Mixed diabetic hyperlipidemia associated with type 2 diabetes mellitus Continue low sugar diet and basal as well as mealtime insulin. - Hemoglobin A1c; Future - Comp Met (CMET); Future   Pleuritic pain Follow chest x-ray. - DG Chest 2 View; Future   GERD without esophagitis Continue proton pump inhibitor.  Follow-up date to be determined after lab, chest x-ray review and your update on how you are doing.

## 2023-04-10 ENCOUNTER — Other Ambulatory Visit: Payer: 59

## 2023-04-11 ENCOUNTER — Other Ambulatory Visit (INDEPENDENT_AMBULATORY_CARE_PROVIDER_SITE_OTHER): Payer: 59

## 2023-04-11 DIAGNOSIS — E1169 Type 2 diabetes mellitus with other specified complication: Secondary | ICD-10-CM | POA: Diagnosis not present

## 2023-04-11 DIAGNOSIS — E782 Mixed hyperlipidemia: Secondary | ICD-10-CM

## 2023-04-11 DIAGNOSIS — E785 Hyperlipidemia, unspecified: Secondary | ICD-10-CM | POA: Diagnosis not present

## 2023-04-11 LAB — LIPID PANEL
Cholesterol: 201 mg/dL — ABNORMAL HIGH (ref 0–200)
HDL: 45.5 mg/dL (ref >39.00)
LDL Cholesterol: 117 mg/dL — ABNORMAL HIGH (ref 0–99)
NonHDL: 155.25
Total CHOL/HDL Ratio: 4
Triglycerides: 192 mg/dL — ABNORMAL HIGH (ref 0.0–149.0)
VLDL: 38.4 mg/dL (ref 0.0–40.0)

## 2023-04-11 LAB — COMPREHENSIVE METABOLIC PANEL
ALT: 22 U/L (ref 0–53)
AST: 16 U/L (ref 0–37)
Albumin: 4.1 g/dL (ref 3.5–5.2)
Alkaline Phosphatase: 91 U/L (ref 39–117)
BUN: 19 mg/dL (ref 6–23)
CO2: 30 mEq/L (ref 19–32)
Calcium: 9.5 mg/dL (ref 8.4–10.5)
Chloride: 100 mEq/L (ref 96–112)
Creatinine, Ser: 0.84 mg/dL (ref 0.40–1.50)
GFR: 93.12 mL/min (ref >60.00)
Glucose, Bld: 165 mg/dL — ABNORMAL HIGH (ref 70–99)
Potassium: 4.1 mEq/L (ref 3.5–5.1)
Sodium: 141 mEq/L (ref 135–145)
Total Bilirubin: 0.5 mg/dL (ref 0.2–1.2)
Total Protein: 6.4 g/dL (ref 6.0–8.3)

## 2023-04-11 LAB — HEMOGLOBIN A1C: Hgb A1c MFr Bld: 7.6 % — ABNORMAL HIGH (ref 4.6–6.5)

## 2023-04-11 MED ORDER — ATORVASTATIN CALCIUM 20 MG PO TABS: 20.0000 mg | ORAL_TABLET | Freq: Every day | ORAL | 3 refills | Status: DC

## 2023-04-11 NOTE — Addendum Note (Signed)
Addended by: Gwenevere Abbot on: 04/11/2023 02:43 PM   Modules accepted: Orders

## 2023-05-02 ENCOUNTER — Other Ambulatory Visit: Payer: Self-pay | Admitting: Medical

## 2023-05-16 ENCOUNTER — Other Ambulatory Visit: Payer: Self-pay | Admitting: Medical

## 2023-06-10 ENCOUNTER — Other Ambulatory Visit: Payer: Self-pay | Admitting: Medical

## 2023-07-14 DIAGNOSIS — Z0289 Encounter for other administrative examinations: Secondary | ICD-10-CM

## 2023-07-15 ENCOUNTER — Ambulatory Visit (INDEPENDENT_AMBULATORY_CARE_PROVIDER_SITE_OTHER): Payer: 59 | Admitting: Nurse Practitioner

## 2023-07-15 VITALS — BP 124/79 | HR 71 | Temp 97.7°F | Ht 68.0 in | Wt 265.0 lb

## 2023-07-15 DIAGNOSIS — E1159 Type 2 diabetes mellitus with other circulatory complications: Secondary | ICD-10-CM | POA: Diagnosis not present

## 2023-07-15 DIAGNOSIS — E1110 Type 2 diabetes mellitus with ketoacidosis without coma: Secondary | ICD-10-CM | POA: Diagnosis not present

## 2023-07-15 DIAGNOSIS — G4733 Obstructive sleep apnea (adult) (pediatric): Secondary | ICD-10-CM

## 2023-07-15 DIAGNOSIS — E1169 Type 2 diabetes mellitus with other specified complication: Secondary | ICD-10-CM | POA: Diagnosis not present

## 2023-07-15 DIAGNOSIS — E782 Mixed hyperlipidemia: Secondary | ICD-10-CM

## 2023-07-15 DIAGNOSIS — Z6841 Body Mass Index (BMI) 40.0 and over, adult: Secondary | ICD-10-CM

## 2023-07-15 DIAGNOSIS — I152 Hypertension secondary to endocrine disorders: Secondary | ICD-10-CM

## 2023-07-15 DIAGNOSIS — Z9884 Bariatric surgery status: Secondary | ICD-10-CM

## 2023-07-15 NOTE — Progress Notes (Signed)
Office: [phone]  /  Fax: [phone]   Initial Visit  Jerry Griffith was seen in clinic today to evaluate for obesity. He is interested in losing weight to improve overall health and reduce the risk of weight related complications. He presents today to review program treatment options, initial physical assessment, and evaluation.     He was referred by: Friend or Family  When asked what else they would like to accomplish? He states: Improve existing medical conditions, Reduce number of medications, Improve quality of life, and Lose a target amount of weight : Goal weight:  180 lbs  Weight history:  He has struggled with his weight since he was a child.  His weight has fluctuated over the years.    Bariatric surgery:  His s/p SG by Dr. Buzzy Han at [facility].  His highest weight prior to surgery was 350s and his nadir weight after surgery was 230 lbs.  He reports some restriction. He is taking Vit B12, Vit D, MV, iron.    When asked how has your weight affected you? He states: Contributed to medical problems and Having fatigue  Some associated conditions: Hypertension, Hyperlipidemia, OSA, Diabetes, and Vitamin D Deficiency, GERD  Contributing factors: Medications and Reduced physical activity  Weight promoting medications identified: Insulin  Current nutrition plan: None  Current level of physical activity: None  Current or previous pharmacotherapy: None  Response to medication: Never tried medications   Past medical history includes:   Past Medical History:  Diagnosis Date   Arthritis    Cancer (HCC) 2/14   prostate   Diabetes mellitus without complication (HCC)    Headache    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   Sinus congestion    Sleep apnea    cpap     Objective:   BP 124/79   Pulse 71   Temp 97.7 F (36.5 C)   Ht 5\' 8"  (1.727 m)   Wt 265 lb (120.2 kg)   SpO2 97%   BMI 40.29 kg/m  He was weighed on the bioimpedance  scale: Body mass index is 40.29 kg/m.  Peak Weight:350s  , Body Fat%:39.2%, Visceral Fat Rating:25, Weight trend over the last 12 months: Increasing  General:  Alert, oriented and cooperative. Patient is in no acute distress.  Respiratory: Normal respiratory effort, no problems with respiration noted   Gait: able to ambulate independently  Mental Status: Normal mood and affect. Normal behavior. Normal judgment and thought content.   DIAGNOSTIC DATA REVIEWED:  BMET    Component Value Date/Time   NA 141 04/11/2023 0801   K 4.1 04/11/2023 0801   CL 100 04/11/2023 0801   CO2 30 04/11/2023 0801   GLUCOSE 165 (H) 04/11/2023 0801   BUN 19 04/11/2023 0801   CREATININE 0.84 04/11/2023 0801   CALCIUM 9.5 04/11/2023 0801   GFRNONAA >60 08/07/2022 0753   GFRAA >60 03/28/2016 1007   Lab Results  Component Value Date   HGBA1C 7.6 (H) 04/11/2023   HGBA1C 11.8 (H) 08/07/2022   No results found for: "INSULIN" CBC    Component Value Date/Time   WBC 6.9 09/10/2022 1002   RBC 4.50 09/10/2022 1002   HGB 13.7 09/10/2022 1002   HCT 41.7 09/10/2022 1002   PLT 262.0 09/10/2022 1002   MCV 92.5 09/10/2022 1002   MCH 30.1 08/06/2022 1154   MCHC 32.8 09/10/2022 1002   RDW 14.2 09/10/2022 1002   Iron/TIBC/Ferritin/ %Sat No results found  for: "IRON", "TIBC", "FERRITIN", "IRONPCTSAT" Lipid Panel     Component Value Date/Time   CHOL 201 (H) 04/11/2023 0801   TRIG 192.0 (H) 04/11/2023 0801   HDL 45.50 04/11/2023 0801   CHOLHDL 4 04/11/2023 0801   VLDL 38.4 04/11/2023 0801   LDLCALC 117 (H) 04/11/2023 0801   Hepatic Function Panel     Component Value Date/Time   PROT 6.4 04/11/2023 0801   ALBUMIN 4.1 04/11/2023 0801   AST 16 04/11/2023 0801   ALT 22 04/11/2023 0801   ALKPHOS 91 04/11/2023 0801   BILITOT 0.5 04/11/2023 0801   No results found for: "TSH"   Assessment and Plan:   Mixed diabetic hyperlipidemia associated with type 2 diabetes mellitus (HCC) Continue to follow up with  PCP and Endo. Continue meds as directed.   Uncontrolled type 2 diabetes mellitus with ketoacidosis without coma, without long-term current use of insulin (HCC) Continue to follow up with PCP and Endo. Continue meds as directed.  Hypertension associated with diabetes (HCC) Continue to follow up with PCP and Endo. Continue meds as directed.  OSA on CPAP Continue CPAP nightly  History of bariatric surgery Will obtain labs at next visit  Morbid obesity (HCC)  BMI 40.0-44.9, adult (HCC)        Obesity Treatment / Action Plan:  Patient will work on garnering support from family and friends to begin weight loss journey. Will work on eliminating or reducing the presence of highly palatable, calorie dense foods in the home. Will complete provided nutritional and psychosocial assessment questionnaire before the next appointment. Will be scheduled for indirect calorimetry to determine resting energy expenditure in a fasting state.  This will allow [place] to create a reduced calorie, high-protein meal plan to promote loss of fat mass while preserving muscle mass. Counseled on the health benefits of losing 5%-15% of total body weight. Was counseled on nutritional approaches to weight loss and benefits of reducing processed foods and consuming plant-based foods and high quality protein as part of nutritional weight management. Was counseled on pharmacotherapy and role as an adjunct in weight management.   Obesity Education Performed Today:  He was weighed on the bioimpedance scale and results were discussed and documented in the synopsis.  We discussed obesity as a disease and the importance of a more detailed evaluation of all the factors contributing to the disease.  We discussed the importance of long term lifestyle changes which include nutrition, exercise and behavioral modifications as well as the importance of customizing this to his specific health and social needs.  We discussed the  benefits of reaching a healthier weight to alleviate the symptoms of existing conditions and reduce the risks of the biomechanical, metabolic and psychological effects of obesity.  Jerry Griffith appears to be in the action stage of change and states they are ready to start intensive lifestyle modifications and behavioral modifications.  40 minutes was spent today on this visit including the above counseling, pre-visit chart review, and post-visit documentation.  Reviewed by clinician on day of visit: allergies, medications, problem list, medical history, surgical history, family history, social history, and previous encounter notes pertinent to obesity diagnosis.    Theodis Sato Jerry Mastrangelo FNP-C

## 2023-07-15 NOTE — Patient Instructions (Signed)

## 2023-07-25 ENCOUNTER — Other Ambulatory Visit: Payer: Self-pay | Admitting: Medical

## 2023-08-01 ENCOUNTER — Other Ambulatory Visit: Payer: Self-pay | Admitting: Medical

## 2023-08-14 ENCOUNTER — Encounter: Payer: Self-pay | Admitting: Sports Medicine

## 2023-08-14 ENCOUNTER — Ambulatory Visit: Payer: 59 | Admitting: Sports Medicine

## 2023-08-14 ENCOUNTER — Ambulatory Visit (HOSPITAL_BASED_OUTPATIENT_CLINIC_OR_DEPARTMENT_OTHER)
Admission: RE | Admit: 2023-08-14 | Discharge: 2023-08-14 | Disposition: A | Discharge status: Home / Self Care | Payer: 59 | Source: Ambulatory Visit | Attending: Sports Medicine | Admitting: Sports Medicine

## 2023-08-14 VITALS — BP 130/78 | Ht 68.0 in | Wt 265.0 lb

## 2023-08-14 DIAGNOSIS — G8929 Other chronic pain: Secondary | ICD-10-CM | POA: Insufficient documentation

## 2023-08-14 DIAGNOSIS — M25561 Pain in right knee: Secondary | ICD-10-CM | POA: Insufficient documentation

## 2023-08-14 MED ORDER — METHYLPREDNISOLONE ACETATE 40 MG/ML IJ SUSP
40.0000 mg | Freq: Once | INTRAMUSCULAR | Status: AC
Start: 2023-08-14 — End: 2023-08-14
  Administered 2023-08-14: 40 mg via INTRA_ARTICULAR

## 2023-08-14 NOTE — Progress Notes (Signed)
   Subjective:    Patient ID: Jerry Griffith, male    DOB: 1961-03-21, 62 y.o.   MRN: [id number]  HPI chief complaint: Right knee pain  Patient is a very pleasant 62 year old male that presents today complaining of right knee pain.  He has had pain in this knee before.  He saw Dr. Jordan Likes several months ago and received a compression sleeve which, although it was helpful, was a little too tight so he discontinued using it.  He was doing pretty well up until about 2-1/2 months ago when his pain began to return.  His pain is most noticeable with driving and prolonged walking.  He has noticed some swelling.  Pain is primarily in the anterior knee.  He has tried Tylenol without any real relief.  Denies any prior knee surgeries.  No significant injuries to the knee.  He is here today with his wife.   Review of Systems As above    Objective:   Physical Exam  Well-developed, well-nourished.  No acute distress  Right knee: Good range of motion.  No obvious effusion.  There is tenderness to palpation along medial joint line with a positive McMurray's.  No tenderness along the lateral joint line.  Knee is stable to valgus and varus stressing.  Negative anterior drawer, negative posterior drawer.  Neurovascularly intact distally.  Walking with a slight limp.  Limited ultrasound of the right knee done by Dr. Jordan Likes in November 2023 showed some degenerative changes along the medial meniscus  X-rays today of the right knee including AP, lateral, and sunrise views shows some mild tricompartmental degenerative changes with some spurring off of the superior patella.  Nothing acute.      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  He tolerated this without difficulty.  He will start home exercise program consisting of isometric quad and hip abductor strengthening exercises.   Follow-up with me again in 4 weeks.  If symptoms persist then we may need to consider merits of further diagnostic imaging at that time.  Call with questions or concerns in the interim.  Consent obtained and verified. Time-out conducted. Noted no overlying erythema, induration, or other signs of local infection. Skin prepped in a sterile fashion. Topical analgesic spray: Ethyl chloride. Joint: Right knee Needle: 25-gauge 1.5 inch Completed without difficulty. Meds: 3 cc 1% Xylocaine, 1 cc (40 mg) Depo-Medrol  This note was dictated using Dragon naturally speaking software and may contain errors in syntax, spelling, or content which have not been identified prior to signing this note.

## 2023-08-19 ENCOUNTER — Encounter: Payer: Self-pay | Admitting: Bariatrics

## 2023-08-19 ENCOUNTER — Ambulatory Visit (INDEPENDENT_AMBULATORY_CARE_PROVIDER_SITE_OTHER): Payer: 59 | Admitting: Bariatrics

## 2023-08-19 VITALS — BP 124/82 | HR 93 | Temp 97.9°F | Ht 68.0 in | Wt 261.0 lb

## 2023-08-19 DIAGNOSIS — R5383 Other fatigue: Secondary | ICD-10-CM | POA: Diagnosis not present

## 2023-08-19 DIAGNOSIS — R0602 Shortness of breath: Secondary | ICD-10-CM | POA: Diagnosis not present

## 2023-08-19 DIAGNOSIS — E1159 Type 2 diabetes mellitus with other circulatory complications: Secondary | ICD-10-CM | POA: Diagnosis not present

## 2023-08-19 DIAGNOSIS — K2100 Gastro-esophageal reflux disease with esophagitis, without bleeding: Secondary | ICD-10-CM

## 2023-08-19 DIAGNOSIS — E1110 Type 2 diabetes mellitus with ketoacidosis without coma: Secondary | ICD-10-CM | POA: Diagnosis not present

## 2023-08-19 DIAGNOSIS — E559 Vitamin D deficiency, unspecified: Secondary | ICD-10-CM

## 2023-08-19 DIAGNOSIS — E782 Mixed hyperlipidemia: Secondary | ICD-10-CM

## 2023-08-19 DIAGNOSIS — Z0289 Encounter for other administrative examinations: Secondary | ICD-10-CM

## 2023-08-19 DIAGNOSIS — I152 Hypertension secondary to endocrine disorders: Secondary | ICD-10-CM | POA: Insufficient documentation

## 2023-08-19 DIAGNOSIS — E1169 Type 2 diabetes mellitus with other specified complication: Secondary | ICD-10-CM

## 2023-08-19 DIAGNOSIS — Z794 Long term (current) use of insulin: Secondary | ICD-10-CM

## 2023-08-19 DIAGNOSIS — Z6839 Body mass index (BMI) 39.0-39.9, adult: Secondary | ICD-10-CM | POA: Insufficient documentation

## 2023-08-19 DIAGNOSIS — Z9884 Bariatric surgery status: Secondary | ICD-10-CM | POA: Insufficient documentation

## 2023-08-19 DIAGNOSIS — Z1331 Encounter for screening for depression: Secondary | ICD-10-CM

## 2023-08-19 MED ORDER — NOVOLOG FLEXPEN 100 UNIT/ML ~~LOC~~ SOPN
14.0000 [IU] | PEN_INJECTOR | Freq: Two times a day (BID) | SUBCUTANEOUS | 0 refills | Status: DC
Start: 2023-08-19 — End: 2024-02-10

## 2023-08-19 MED ORDER — OMEPRAZOLE 20 MG PO TBEC
1.0000 | DELAYED_RELEASE_TABLET | Freq: Two times a day (BID) | ORAL | 0 refills | Status: DC
Start: 2023-08-19 — End: 2023-12-09

## 2023-08-20 ENCOUNTER — Encounter (INDEPENDENT_AMBULATORY_CARE_PROVIDER_SITE_OTHER): Payer: Self-pay | Admitting: Bariatrics

## 2023-08-20 ENCOUNTER — Encounter: Payer: Self-pay | Admitting: Bariatrics

## 2023-08-20 DIAGNOSIS — R7989 Other specified abnormal findings of blood chemistry: Secondary | ICD-10-CM | POA: Insufficient documentation

## 2023-08-20 LAB — LIPID PANEL WITH LDL/HDL RATIO
Cholesterol, Total: 163 mg/dL (ref 100–199)
HDL: 41 mg/dL (ref >39)
LDL Chol Calc (NIH): 91 mg/dL (ref 0–99)
LDL/HDL Ratio: 2.2 ratio (ref 0.0–3.6)
Triglycerides: 177 mg/dL — ABNORMAL HIGH (ref 0–149)
VLDL Cholesterol Cal: 31 mg/dL (ref 5–40)

## 2023-08-20 LAB — VITAMIN D 25 HYDROXY (VIT D DEFICIENCY, FRACTURES): Vit D, 25-Hydroxy: 23.8 ng/mL — ABNORMAL LOW (ref 30.0–100.0)

## 2023-08-20 LAB — CBC WITH DIFFERENTIAL/PLATELET
Basophils Absolute: 0 10*3/uL (ref 0.0–0.2)
Basos: 1 %
EOS (ABSOLUTE): 0.2 10*3/uL (ref 0.0–0.4)
Eos: 2 %
Hematocrit: 45.5 % (ref 37.5–51.0)
Hemoglobin: 15.2 g/dL (ref 13.0–17.7)
Immature Grans (Abs): 0.1 10*3/uL (ref 0.0–0.1)
Immature Granulocytes: 1 %
Lymphocytes Absolute: 1.9 10*3/uL (ref 0.7–3.1)
Lymphs: 22 %
MCH: 29.7 pg (ref 26.6–33.0)
MCHC: 33.4 g/dL (ref 31.5–35.7)
MCV: 89 fL (ref 79–97)
Monocytes Absolute: 0.6 10*3/uL (ref 0.1–0.9)
Monocytes: 6 %
Neutrophils Absolute: 5.8 10*3/uL (ref 1.4–7.0)
Neutrophils: 68 %
Platelets: 302 10*3/uL (ref 150–450)
RBC: 5.12 x10E6/uL (ref 4.14–5.80)
RDW: 12.3 % (ref 11.6–15.4)
WBC: 8.6 10*3/uL (ref 3.4–10.8)

## 2023-08-20 LAB — COMPREHENSIVE METABOLIC PANEL
ALT: 32 IU/L (ref 0–44)
AST: 19 IU/L (ref 0–40)
Albumin: 4.3 g/dL (ref 3.9–4.9)
Alkaline Phosphatase: 113 IU/L (ref 44–121)
BUN/Creatinine Ratio: 16 (ref 10–24)
BUN: 14 mg/dL (ref 8–27)
Bilirubin Total: 0.5 mg/dL (ref 0.0–1.2)
CO2: 23 mmol/L (ref 20–29)
Calcium: 9.8 mg/dL (ref 8.6–10.2)
Chloride: 100 mmol/L (ref 96–106)
Creatinine, Ser: 0.86 mg/dL (ref 0.76–1.27)
Globulin, Total: 2.6 g/dL (ref 1.5–4.5)
Glucose: 121 mg/dL — ABNORMAL HIGH (ref 70–99)
Potassium: 3.7 mmol/L (ref 3.5–5.2)
Sodium: 143 mmol/L (ref 134–144)
Total Protein: 6.9 g/dL (ref 6.0–8.5)
eGFR: 97 mL/min/{1.73_m2} (ref >59)

## 2023-08-20 LAB — TSH: TSH: 1.63 u[IU]/mL (ref 0.450–4.500)

## 2023-08-20 LAB — HEMOGLOBIN A1C
Est. average glucose Bld gHb Est-mCnc: 163 mg/dL
Hgb A1c MFr Bld: 7.3 % — ABNORMAL HIGH (ref 4.8–5.6)

## 2023-08-20 LAB — VITAMIN B12: Vitamin B-12: 1263 pg/mL — ABNORMAL HIGH (ref 232–1245)

## 2023-08-20 LAB — INSULIN, RANDOM: INSULIN: 25.4 u[IU]/mL — ABNORMAL HIGH (ref 2.6–24.9)

## 2023-08-20 NOTE — Progress Notes (Signed)
Chief Complaint:   OBESITY Jerry Griffith (MR# [id number]) is a 62 y.o. male who presents for evaluation and treatment of obesity and related comorbidities. Current BMI is Body mass index is 39.68 kg/m. Jerry Griffith has been struggling with his weight for many years and has been unsuccessful in either losing weight, maintaining weight loss, or reaching his healthy weight goal.  Jerry Griffith is currently in the action stage of change and ready to dedicate time achieving and maintaining a healthier weight. Jerry Griffith is interested in becoming our patient and working on intensive lifestyle modifications including (but not limited to) diet and exercise for weight loss.  Patient met with Irene Limbo, FNP-C on 07/15/2023 for his information session.  He likes to cook, but he skips his lunch.  Jerry Griffith's habits were reviewed today and are as follows: His family eats meals together, he thinks his family will eat healthier with him, his desired weight loss is 61-81 lbs, he started gaining weight when he got married, his heaviest weight ever was 350 pounds, he is a picky eater and doesn't like to eat healthier foods, he has significant food cravings issues, he skips meals frequently, he is frequently drinking liquids with calories, and he struggles with emotional eating.  Depression Screen Jerry Griffith's Food and Mood (modified PHQ-9) score was 3.  Subjective:   1. Other fatigue Jerry Griffith admits to daytime somnolence and admits to waking up still tired. Patient has a history of symptoms of daytime fatigue and morning fatigue. Jerry Griffith generally gets 6 hours of sleep per night, and states that he has nightime awakenings and generally restful sleep. Snoring is present. Apneic episodes are present. Epworth Sleepiness Score is 4.   2. SOB (shortness of breath) on exertion Jerry Griffith notes increasing shortness of breath with exercising and seems to be worsening over time with weight gain. He notes getting out of  breath sooner with activity than he used to. This has not gotten worse recently. Jerry Griffith denies shortness of breath at rest or orthopnea.  3. Uncontrolled type 2 diabetes mellitus with ketoacidosis without coma, without long-term current use of insulin [facility]) Patient has had bariatric surgery in 2020-2021.  His last A1c was 7.6, and his fasting blood sugars range at 140 and before dinner 106-164.  He is taking NovoLog 14 units before breakfast and dinner, Ozempic, Toujeo 30 units, and using Dexcom G7.  4. Hypertension associated with diabetes (HCC) Patient is taking chlorthalidone and losartan.  5. Mixed diabetic hyperlipidemia associated with type 2 diabetes mellitus (HCC) Patient is taking atorvastatin.  6. Vitamin D deficiency Patient is taking vitamin D.  7. 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  No complications noted.  His highest weight was 351 pounds, and his lowest weight was 210-220 pounds.  8. Gastroesophageal reflux disease with esophagitis, unspecified whether hemorrhage Patient notes his symptoms have been slightly worse lately, and he is taking Tums.  Assessment/Plan:   1. Other fatigue Jerry Griffith does feel that his weight is causing his energy to be lower than it should be. Fatigue may be related to obesity, depression or many other causes. Labs will be ordered, and in the meanwhile, Jerry Griffith will focus on self care including making healthy food choices, increasing physical activity and focusing on stress reduction.  - TSH - CBC with Differential/Platelet  2. SOB (shortness of breath) on exertion Jerry Griffith does feel that he gets out of breath more easily that he used to when he exercises. Jerry Griffith shortness of breath appears  to be obesity related and exercise induced. He has agreed to work on weight loss and gradually increase exercise to treat his exercise induced shortness of breath. Will continue to monitor closely.  3. Uncontrolled type  2 diabetes mellitus with ketoacidosis without coma, without long-term current use of insulin (HCC) We will check labs today.  Patient will continue his medications except for changes with NovoLog; (no NovoLog if glucose levels less than 80), and Toujeo 20 units at night.  - Hemoglobin A1c - Insulin, random - insulin aspart (NOVOLOG FLEXPEN) 100 UNIT/ML FlexPen; Inject 14 Units into the skin 2 (two) times daily.  Dispense: 15 mL; Refill: 0  4. Hypertension associated with diabetes (HCC) We will check labs today.  Patient will continue his medications as directed.  - Comprehensive metabolic panel  5. Mixed diabetic hyperlipidemia associated with type 2 diabetes mellitus (HCC) We will check labs today.  Patient will continue his medications as directed.  - Hemoglobin A1c - Comprehensive metabolic panel  6. Vitamin D deficiency We will check labs today. Patient will continue vitamin D as directed.  - VITAMIN D 25 Hydroxy (Vit-D Deficiency, Fractures)  7. History of bariatric surgery We will check labs today.  Patient is to eat small frequent meals, increase his protein intake, and take his vitamins as directed.  - Hemoglobin A1c - Insulin, random - Vitamin B12 - Lipid Panel With LDL/HDL Ratio - VITAMIN D 25 Hydroxy (Vit-D Deficiency, Fractures) - TSH - Comprehensive metabolic panel - CBC with Differential/Platelet  8. Gastroesophageal reflux disease with esophagitis, unspecified whether hemorrhage Patient agreed to start omeprazole 20 mg twice daily with no refills.  If his pain persist, he is to call his PCP.  - Omeprazole (KLS OMPERAZOLE) 20 MG TBEC; Take 1 tablet (20 mg total) by mouth 2 (two) times daily.  Dispense: 168 tablet; Refill: 0  9. Depression screening Jerry Griffith had a negative depression screening.   10. Morbid obesity (HCC)  11. BMI 39.0-39.9,adult Jerry Griffith is currently in the action stage of change and his goal is to continue with weight loss efforts. I  recommend Jerry Griffith begin the structured treatment plan as follows:  He has agreed to the Category 3 Plan.  Meal planning and intentional eating were discussed.  Reviewed medications, and reviewed labs with the patient from/18/2024, CMP, lipids, A1c, and glucose.  Handout on seasonings was given.  Exercise goals: No exercise has been prescribed at this time.   Behavioral modification strategies: increasing lean protein intake, decreasing simple carbohydrates, increasing vegetables, increasing water intake, decreasing eating out, no skipping meals, meal planning and cooking strategies, keeping healthy foods in the home, and planning for success.  He was informed of the importance of frequent follow-up visits to maximize his success with intensive lifestyle modifications for his multiple health conditions. He was informed we would discuss his lab results at his next visit unless there is a critical issue that needs to be addressed sooner. Jerry Griffith agreed to keep his next visit at the agreed upon time to discuss these results.  Objective:   Blood pressure 124/82, pulse 93, temperature 97.9 F (36.6 C), height 5\' 8"  (1.727 m), weight 261 lb (118.4 kg), SpO2 98%. Body mass index is 39.68 kg/m.  EKG: Normal sinus rhythm, rate 85 BPM.  Indirect Calorimeter completed today shows a VO2 of 320 and a REE of 2203.  His calculated basal metabolic rate is 4098 thus his basal metabolic rate is worse than expected.  General: Cooperative, alert, well developed, in no  acute distress. HEENT: Conjunctivae and lids unremarkable. Cardiovascular: Regular rhythm.  Lungs: Normal work of breathing. Neurologic: No focal deficits.   Lab Results  Component Value Date   CREATININE 0.86 08/19/2023   BUN 14 08/19/2023   NA 143 08/19/2023   K 3.7 08/19/2023   CL 100 08/19/2023   CO2 23 08/19/2023   Lab Results  Component Value Date   ALT 32 08/19/2023   AST 19 08/19/2023   ALKPHOS 113 08/19/2023   BILITOT 0.5  08/19/2023   Lab Results  Component Value Date   HGBA1C 7.3 (H) 08/19/2023   HGBA1C 7.6 (H) 04/11/2023   HGBA1C 7.1 (H) 11/12/2022   HGBA1C 11.8 (H) 08/07/2022   Lab Results  Component Value Date   INSULIN 25.4 (H) 08/19/2023   Lab Results  Component Value Date   TSH 1.630 08/19/2023   Lab Results  Component Value Date   CHOL 163 08/19/2023   HDL 41 08/19/2023   LDLCALC 91 08/19/2023   TRIG 177 (H) 08/19/2023   CHOLHDL 4 04/11/2023   Lab Results  Component Value Date   WBC 8.6 08/19/2023   HGB 15.2 08/19/2023   HCT 45.5 08/19/2023   MCV 89 08/19/2023   PLT 302 08/19/2023   No results found for: "IRON", "TIBC", "FERRITIN"  Attestation Statements:   Reviewed by clinician on day of visit: allergies, medications, problem list, medical history, surgical history, family history, social history, and previous encounter notes.   Time spent on visit including pre-visit chart review and post-visit charting and care was  52 minutes.    Trude Mcburney, am acting as Energy manager for [employer], DO.  I have reviewed the above documentation for accuracy and completeness, and I agree with the above. Corinna Capra, DO

## 2023-09-02 ENCOUNTER — Encounter: Payer: Self-pay | Admitting: Bariatrics

## 2023-09-02 ENCOUNTER — Ambulatory Visit: Payer: 59 | Admitting: Bariatrics

## 2023-09-02 VITALS — BP 117/76 | HR 79 | Temp 98.1°F | Ht 68.0 in | Wt 267.0 lb

## 2023-09-02 DIAGNOSIS — Z794 Long term (current) use of insulin: Secondary | ICD-10-CM

## 2023-09-02 DIAGNOSIS — E1110 Type 2 diabetes mellitus with ketoacidosis without coma: Secondary | ICD-10-CM

## 2023-09-02 DIAGNOSIS — Z6841 Body Mass Index (BMI) 40.0 and over, adult: Secondary | ICD-10-CM

## 2023-09-02 DIAGNOSIS — E559 Vitamin D deficiency, unspecified: Secondary | ICD-10-CM

## 2023-09-02 MED ORDER — VITAMIN D (ERGOCALCIFEROL) 1.25 MG (50000 UNIT) PO CAPS: 50000.0000 [IU] | ORAL_CAPSULE | ORAL | 0 refills | Status: DC

## 2023-09-03 NOTE — Progress Notes (Unsigned)
Chief Complaint:   OBESITY Jerry Griffith is here to discuss his progress with his obesity treatment plan along with follow-up of his obesity related diagnoses. Jerry Griffith is on the Category 3 Plan and states he is following his eating plan approximately 70% of the time. Jerry Griffith states he is not exercising.   Today's visit was #: 2 Starting weight: 261 lb Starting date: 08/19/2023 Today's weight: 267 lb Today's date: 09/02/2023 Total lbs lost to date: 6 lb Total lbs lost since last in-office visit: 6 lb  Interim History: Jerry Griffith is up 6 lb since his initial.  He has moved downtown.  He is eating more comfort food, but has been cleaning out his cabinets.  Subjective:   1. Uncontrolled type 2 diabetes mellitus with ketoacidosis without coma, without long-term current use of insulin (HCC) Patient is using Toujeo and NovoLog.  Hemoglobin A1c 7.3, insulin 25.4.  FBS, 128, 130, up to 160.  Blood sugars are decreased in the afternoon.  2. Vitamin D deficiency Vitamin D level was 23.8.  Assessment/Plan:   1. Uncontrolled type 2 diabetes mellitus with ketoacidosis without coma, without long-term current use of insulin (HCC) ContinueToujeo 20 units daily and NovoLog 14 units if <80-90, no NovoLog.  2. Vitamin D deficiency Refill - Vitamin D, Ergocalciferol, (DRISDOL) 1.25 MG (50000 UNIT) CAPS capsule; Take 1 capsule (50,000 Units total) by mouth every 7 (seven) days.  Dispense: 5 capsule; Refill: 0  3. Morbid obesity (HCC)  4. BMI 40.0-44.9, adult [facility]) Jl is currently in the action stage of change. As such, his goal is to continue with weight loss efforts. He has agreed to the Category 3 Plan.   1.  Meal planning. 2.  Review labs from 08/19/2023. CMP, lipids, vitamin D, vitamin B12, CBC, hemoglobin A1c, glucose, insulin level. 3.  Will stop fried foods.  Exercise goals:  Patient has right knee pain, he will try chair exercises and weights.   Behavioral modification strategies:  increasing lean protein intake, decreasing simple carbohydrates, increasing vegetables, increasing water intake, decreasing eating out, no skipping meals, meal planning and cooking strategies, keeping healthy foods in the home, and planning for success.  Jerry Griffith has agreed to follow-up with our clinic in 2 weeks. He was informed of the importance of frequent follow-up visits to maximize his success with intensive lifestyle modifications for his multiple health conditions.   Objective:   Blood pressure 117/76, pulse 79, temperature 98.1 F (36.7 C), height 5\' 8"  (1.727 m), weight 267 lb (121.1 kg), SpO2 99%. Body mass index is 40.6 kg/m.  General: Cooperative, alert, well developed, in no acute distress. HEENT: Conjunctivae and lids unremarkable. Cardiovascular: Regular rhythm.  Lungs: Normal work of breathing. Neurologic: No focal deficits.   Lab Results  Component Value Date   CREATININE 0.86 08/19/2023   BUN 14 08/19/2023   NA 143 08/19/2023   K 3.7 08/19/2023   CL 100 08/19/2023   CO2 23 08/19/2023   Lab Results  Component Value Date   ALT 32 08/19/2023   AST 19 08/19/2023   ALKPHOS 113 08/19/2023   BILITOT 0.5 08/19/2023   Lab Results  Component Value Date   HGBA1C 7.3 (H) 08/19/2023   HGBA1C 7.6 (H) 04/11/2023   HGBA1C 7.1 (H) 11/12/2022   HGBA1C 11.8 (H) 08/07/2022   Lab Results  Component Value Date   INSULIN 25.4 (H) 08/19/2023   Lab Results  Component Value Date   TSH 1.630 08/19/2023   Lab Results  Component Value Date  CHOL 163 08/19/2023   HDL 41 08/19/2023   LDLCALC 91 08/19/2023   TRIG 177 (H) 08/19/2023   CHOLHDL 4 04/11/2023   Lab Results  Component Value Date   VD25OH 23.8 (L) 08/19/2023   Lab Results  Component Value Date   WBC 8.6 08/19/2023   HGB 15.2 08/19/2023   HCT 45.5 08/19/2023   MCV 89 08/19/2023   PLT 302 08/19/2023   No results found for: "IRON", "TIBC", "FERRITIN"  Attestation Statements:   Reviewed by clinician on  day of visit: allergies, medications, problem list, medical history, surgical history, family history, social history, and previous encounter notes.  Silvana Newness, am acting as Energy manager for [employer], DO.  I have reviewed the above documentation for accuracy and completeness, and I agree with the above. Corinna Capra, DO

## 2023-09-11 ENCOUNTER — Ambulatory Visit: Payer: 59 | Admitting: Sports Medicine

## 2023-09-11 ENCOUNTER — Encounter: Payer: Self-pay | Admitting: Sports Medicine

## 2023-09-11 VITALS — BP 110/72 | HR 101 | Ht 68.0 in | Wt 267.0 lb

## 2023-09-11 DIAGNOSIS — M25561 Pain in right knee: Secondary | ICD-10-CM

## 2023-09-11 DIAGNOSIS — G8929 Other chronic pain: Secondary | ICD-10-CM | POA: Diagnosis not present

## 2023-09-11 NOTE — Progress Notes (Signed)
Subjective:    Patient ID: Jerry Griffith, male    DOB: 08/03/1961, 62 y.o.   MRN: [id number]  HPI  Jerry Griffith presents today for follow-up on right knee pain.  Unfortunately he continues to have pain despite recent cortisone injection and home exercises.  He is now needing a cane to help with ambulation.  He describes frequent instability in the knee which causes him to fall.  He continues to wear a compression sleeve.  Recent x-rays showed minimal degenerative changes in his knee.   Review of Systems As above    Objective:   Physical Exam  Well-developed, well-nourished.  No acute distress  Right knee: Range of motion is 0-1 20.  Trace effusion.  He is tender to palpation along the medial joint line with a positive McMurray's.  No tenderness laterally.  Knee is grossly stable ligamentous exam.  Neurovascularly intact distally.      Assessment & Plan:   Persistent right knee pain worrisome for medial meniscal tear  Given the patient's persistent pain despite recent cortisone injection, we will order an MRI of his knee specifically to rule out a medial meniscal tear which may benefit from arthroscopy.  I will MyChart message him with those results when available and we will delineate further treatment based on those findings.  In the meantime, he will provide me with some FMLA paperwork to fill out for his employer.  This note was dictated using Dragon naturally speaking software and may contain errors in syntax, spelling, or content which have not been identified prior to signing this note.

## 2023-09-25 ENCOUNTER — Ambulatory Visit (HOSPITAL_BASED_OUTPATIENT_CLINIC_OR_DEPARTMENT_OTHER)
Admission: RE | Admit: 2023-09-25 | Discharge: 2023-09-25 | Disposition: A | Discharge status: Home / Self Care | Payer: 59 | Source: Ambulatory Visit | Attending: Sports Medicine | Admitting: Sports Medicine

## 2023-09-25 DIAGNOSIS — G8929 Other chronic pain: Secondary | ICD-10-CM | POA: Diagnosis present

## 2023-09-25 DIAGNOSIS — M25561 Pain in right knee: Secondary | ICD-10-CM | POA: Diagnosis present

## 2023-10-04 ENCOUNTER — Encounter: Payer: Self-pay | Admitting: Sports Medicine

## 2023-10-06 ENCOUNTER — Other Ambulatory Visit: Payer: Self-pay | Admitting: Bariatrics

## 2023-10-06 DIAGNOSIS — E559 Vitamin D deficiency, unspecified: Secondary | ICD-10-CM

## 2023-10-08 ENCOUNTER — Encounter: Payer: Self-pay | Admitting: Sports Medicine

## 2023-10-09 ENCOUNTER — Encounter: Payer: Self-pay | Admitting: Bariatrics

## 2023-10-09 ENCOUNTER — Telehealth: Payer: Self-pay | Admitting: *Deleted

## 2023-10-09 ENCOUNTER — Encounter: Payer: Self-pay | Admitting: Sports Medicine

## 2023-10-09 ENCOUNTER — Ambulatory Visit: Payer: 59 | Admitting: Sports Medicine

## 2023-10-09 ENCOUNTER — Ambulatory Visit: Payer: 59 | Admitting: Bariatrics

## 2023-10-09 VITALS — BP 130/84 | Ht 68.0 in | Wt 259.0 lb

## 2023-10-09 VITALS — BP 126/77 | HR 77 | Temp 97.9°F | Ht 68.0 in | Wt 259.0 lb

## 2023-10-09 DIAGNOSIS — S83241D Other tear of medial meniscus, current injury, right knee, subsequent encounter: Secondary | ICD-10-CM | POA: Diagnosis not present

## 2023-10-09 DIAGNOSIS — Z6839 Body mass index (BMI) 39.0-39.9, adult: Secondary | ICD-10-CM

## 2023-10-09 DIAGNOSIS — M23203 Derangement of unspecified medial meniscus due to old tear or injury, right knee: Secondary | ICD-10-CM

## 2023-10-09 DIAGNOSIS — E669 Obesity, unspecified: Secondary | ICD-10-CM

## 2023-10-09 DIAGNOSIS — Z7985 Long-term (current) use of injectable non-insulin antidiabetic drugs: Secondary | ICD-10-CM | POA: Diagnosis not present

## 2023-10-09 DIAGNOSIS — E1110 Type 2 diabetes mellitus with ketoacidosis without coma: Secondary | ICD-10-CM | POA: Diagnosis not present

## 2023-10-09 MED ORDER — VITAMIN D (ERGOCALCIFEROL) 1.25 MG (50000 UNIT) PO CAPS: 50000.0000 [IU] | ORAL_CAPSULE | ORAL | 0 refills | Status: DC

## 2023-10-09 NOTE — Telephone Encounter (Signed)
Referral placed and faxed.

## 2023-10-09 NOTE — Progress Notes (Signed)
WEIGHT SUMMARY AND BIOMETRICS  Weight Lost Since Last Visit: 8lb  Weight Gained Since Last Visit: 0   Vitals Temp: 97.9 F (36.6 C) BP: 126/77 Pulse Rate: 77 SpO2: 100 %   Anthropometric Measurements Height: 5\' 8"  (1.727 m) Weight: 259 lb (117.5 kg) BMI (Calculated): 39.39 Weight at Last Visit: 267lb Weight Lost Since Last Visit: 8lb Weight Gained Since Last Visit: 0 Starting Weight: 261lb Total Weight Loss (lbs): 14 lb (6.35 kg)   Body Composition  Body Fat %: 39.3 % Fat Mass (lbs): 101.8 lbs Muscle Mass (lbs): 149.4 lbs Total Body Water (lbs): 113 lbs Visceral Fat Rating : 25   Other Clinical Data Fasting: yes Labs: no Today's Visit #: 3 Starting Date: 08/19/23    OBESITY Jerry Griffith is here to discuss his progress with his obesity treatment plan along with follow-up of his obesity related diagnoses.     Nutrition Plan: the Category 3 plan - 80-85% adherence.  Current exercise: none  Interim History:  He is down 8 lbs since his last visit.  Eating all of the food on the plan., Protein intake is as prescribed, Is not skipping meals, Water intake is adequate., and Denies polyphagia  Pharmacotherapy: Jerry Griffith is on Jerry Griffith 2 mg SQ weekly Adverse side effects: None Hunger is moderately controlled.  Cravings are moderately controlled.  Assessment/Plan:   Type II Diabetes HgbA1c is not at goal. Last A1c was 7.3 CBGs: Fasting 140 to 160       Episodes of hypoglycemia: no Medication(s): Jerry Griffith 2 mg SQ weekly ( per patient )   Lab Results  Component Value Date   HGBA1C 7.3 (H) 08/19/2023   HGBA1C 7.6 (H) 04/11/2023   HGBA1C 7.1 (H) 11/12/2022   Lab Results  Component Value Date   LDLCALC 91 08/19/2023   CREATININE 0.86 08/19/2023   Lab Results  Component Value Date   GFR 93.12 04/11/2023   GFR 97.84 11/12/2022   GFR 98.37 09/10/2022    Plan: Continue Jerry Griffith 2 mg SQ weekly Continue all other medications.  Will keep all  carbohydrates low both sweets and starches.  Will continue exercise regimen to 30 to 60 minutes on most days of the week.  Aim for 7 to 9 hours of sleep nightly.  Eat more low glycemic index foods.   Vitamin D Deficiency Vitamin D is not at goal of 50.  Most recent vitamin D level was 23.8. He is on  prescription ergocalciferol 50,000 IU weekly. Lab Results  Component Value Date   VD25OH 23.8 (L) 08/19/2023    Plan: Refill prescription vitamin D 50,000 IU weekly.    Generalized Obesity: Current BMI BMI (Calculated): 39.39   Pharmacotherapy Plan Continue  Jerry Griffith 2 mg SQ weekly  Jerry Griffith is currently in the action stage of change. As such, his goal is to continue with weight loss efforts.  He has agreed to the Category 3 plan.  Exercise goals: All adults should avoid inactivity. Some physical activity is better than none, and adults who participate in any amount of physical activity gain some health benefits.  Behavioral modification strategies: increasing lean protein intake, increase water intake, better snacking choices, planning for success, increasing fiber rich foods, keep healthy foods in the home, and mindful eating.  Jerry Griffith has agreed to follow-up with our clinic in 2 weeks.     Objective:   VITALS: Per patient if applicable, see vitals. GENERAL: Alert and in no acute distress. CARDIOPULMONARY: No increased WOB. Speaking in clear sentences.  PSYCH: Pleasant and cooperative. Speech normal rate and rhythm. Affect is appropriate. Insight and judgement are appropriate. Attention is focused, linear, and appropriate.  NEURO: Oriented as arrived to appointment on time with no prompting.   Attestation Statements:   This was prepared with the assistance of Engineer, civil (consulting).  Occasional wrong-word or sound-a-like substitutions may have occurred due to the inherent limitations of voice recognition software. Corinna Capra, DO

## 2023-10-09 NOTE — Telephone Encounter (Signed)
-----   Message from Marsa Aris sent at 10/08/2023 12:55 PM EDT ----- Regarding: Dr. Jerl Santos referral Please refer this patient to Dr. Jerl Santos at [facility] orthopedics.  Diagnosis: Medial meniscal tear ----- Message ----- From: Interface, Rad Results In Sent: 10/06/2023   7:39 AM EDT To: Ralene Cork, DO

## 2023-10-09 NOTE — Progress Notes (Signed)
Patient ID: Jerry Griffith, male   DOB: 10/24/1961, 62 y.o.   MRN: [id number]  Jerry Griffith presents today to discuss MRI findings of his right knee.  MRI shows a complex medial meniscal tear in the setting of osteoarthritis.  I recommended consultation with Dr. Jerl Santos to discuss treatment options including possible arthroscopy.  I will defer further treatment to the discretion of Dr. Jerl Santos and the patient will follow-up with me as needed.  This note was dictated using Dragon naturally speaking software and may contain errors in syntax, spelling, or content which have not been identified prior to signing this note.

## 2023-11-13 ENCOUNTER — Ambulatory Visit: Payer: 59 | Admitting: Bariatrics

## 2023-11-17 ENCOUNTER — Other Ambulatory Visit: Payer: Self-pay | Admitting: Bariatrics

## 2023-11-25 ENCOUNTER — Ambulatory Visit: Payer: 59 | Admitting: Bariatrics

## 2023-12-04 ENCOUNTER — Other Ambulatory Visit: Payer: Self-pay | Admitting: Bariatrics

## 2023-12-07 ENCOUNTER — Other Ambulatory Visit: Payer: Self-pay | Admitting: Bariatrics

## 2023-12-07 DIAGNOSIS — K2100 Gastro-esophageal reflux disease with esophagitis, without bleeding: Secondary | ICD-10-CM

## 2023-12-09 ENCOUNTER — Encounter: Payer: Self-pay | Admitting: Bariatrics

## 2023-12-09 ENCOUNTER — Ambulatory Visit: Payer: 59 | Admitting: Bariatrics

## 2023-12-09 VITALS — BP 139/83 | HR 83 | Temp 98.0°F | Ht 68.0 in | Wt 257.0 lb

## 2023-12-09 DIAGNOSIS — E559 Vitamin D deficiency, unspecified: Secondary | ICD-10-CM

## 2023-12-09 DIAGNOSIS — K2100 Gastro-esophageal reflux disease with esophagitis, without bleeding: Secondary | ICD-10-CM

## 2023-12-09 DIAGNOSIS — Z6839 Body mass index (BMI) 39.0-39.9, adult: Secondary | ICD-10-CM

## 2023-12-09 MED ORDER — OMEPRAZOLE 20 MG PO TBEC
1.0000 | DELAYED_RELEASE_TABLET | Freq: Two times a day (BID) | ORAL | 0 refills | Status: DC
Start: 2023-12-09 — End: 2024-03-02

## 2023-12-09 MED ORDER — VITAMIN D (ERGOCALCIFEROL) 1.25 MG (50000 UNIT) PO CAPS: 50000.0000 [IU] | ORAL_CAPSULE | ORAL | 0 refills | Status: DC

## 2023-12-09 NOTE — Progress Notes (Signed)
WEIGHT SUMMARY AND BIOMETRICS  Weight Lost Since Last Visit: 2lb  Weight Gained Since Last Visit: 0   Vitals Temp: 98 F (36.7 C) BP: 139/83 Pulse Rate: 83 SpO2: 100 %   Anthropometric Measurements Height: 5\' 8"  (1.727 m) Weight: 257 lb (116.6 kg) BMI (Calculated): 39.09 Weight at Last Visit: 259lb Weight Lost Since Last Visit: 2lb Weight Gained Since Last Visit: 0 Starting Weight: 261lb Total Weight Loss (lbs): 4 lb (1.814 kg)   Body Composition  Body Fat %: 38.3 % Fat Mass (lbs): 98.8 lbs Muscle Mass (lbs): 151.2 lbs Total Body Water (lbs): 112.4 lbs Visceral Fat Rating : 24   Other Clinical Data Fasting: no Labs: no Today's Visit #: 4 Starting Date: 08/19/23    OBESITY Abou is here to discuss his progress with his obesity treatment plan along with follow-up of his obesity related diagnoses.    Nutrition Plan: the Category 3 plan - 60% adherence.  Current exercise: none  Interim History:  He is down 2 lbs since his last visit. He has been to [place] and thought that he had gained. He is off his insulin ( regular and long-acting) and is now is on Ozempic.  Eating all of the food on the plan., Protein intake is as prescribed, Is not skipping meals, and Water intake is inadequate.   Pharmacotherapy: Antravious is on Ozempic 1 mg SQ weekly per patient. He will double check/. Adverse side effects: Diarrhea Will add in some fiber.  Hunger is moderately controlled.  Cravings are moderately controlled.  Assessment/Plan:   1. Gastroesophageal reflux disease with esophagitis, unspecified whether hemorrhage Symptoms are well-controlled. Medication: Omeprzole  Plan: Continue PPI. Use antacids such as Tums or Mylanta as needed. May use OTC Pepcid along with PPI if needed. Avoid trigger foods (spicy foods, fatty/fried foods, acidic foods,  coffee, alcohol). Avoid eating within 3 hours of bedtime. Continue to work on weight loss.   2. Vitamin D Deficiency Vitamin D is not at goal of 50.  Most recent vitamin D level was 23.8. He is on  prescription ergocalciferol 50,000 IU weekly. Lab Results  Component Value Date   VD25OH 23.8 (L) 08/19/2023    Plan: Refill prescription vitamin D 50,000 IU weekly.     Morbid Obesity: Current BMI BMI (Calculated): 39.09   Pharmacotherapy Plan Continue  Ozempic (patient to confirm  dose)  Sidi is currently in the action stage of change. As such, his goal is to continue with weight loss efforts.  He has agreed to the Category 3 plan.  Exercise goals: All adults should avoid inactivity. Some physical activity is better than none, and adults who participate in any amount of physical activity gain some health benefits.  Behavioral modification strategies: increasing lean protein intake, decreasing simple carbohydrates , no meal skipping, meal planning , increase water intake, better snacking choices, planning for  success, avoiding temptations, keep healthy foods in the home, weigh protein portions, work on smaller portions, and eat on smaller plate.  Daxston has agreed to follow-up with our clinic in 4 weeks.      Objective:   VITALS: Per patient if applicable, see vitals. GENERAL: Alert and in no acute distress. CARDIOPULMONARY: No increased WOB. Speaking in clear sentences.  PSYCH: Pleasant and cooperative. Speech normal rate and rhythm. Affect is appropriate. Insight and judgement are appropriate. Attention is focused, linear, and appropriate.  NEURO: Oriented as arrived to appointment on time with no prompting.   Attestation Statements:    This was prepared with the assistance of Engineer, civil (consulting).  Occasional wrong-word or sound-a-like substitutions may have occurred due to the inherent limitations of voice recognition   Corinna Capra, DO

## 2024-01-06 ENCOUNTER — Ambulatory Visit: Payer: 59 | Admitting: Bariatrics

## 2024-01-06 ENCOUNTER — Encounter: Payer: Self-pay | Admitting: Bariatrics

## 2024-01-06 VITALS — BP 126/79 | HR 76 | Temp 97.4°F | Ht 68.0 in | Wt 260.0 lb

## 2024-01-06 DIAGNOSIS — E559 Vitamin D deficiency, unspecified: Secondary | ICD-10-CM

## 2024-01-06 DIAGNOSIS — E1169 Type 2 diabetes mellitus with other specified complication: Secondary | ICD-10-CM

## 2024-01-06 DIAGNOSIS — E1110 Type 2 diabetes mellitus with ketoacidosis without coma: Secondary | ICD-10-CM

## 2024-01-06 DIAGNOSIS — Z7985 Long-term (current) use of injectable non-insulin antidiabetic drugs: Secondary | ICD-10-CM | POA: Diagnosis not present

## 2024-01-06 DIAGNOSIS — E119 Type 2 diabetes mellitus without complications: Secondary | ICD-10-CM | POA: Diagnosis not present

## 2024-01-06 MED ORDER — VITAMIN D (ERGOCALCIFEROL) 1.25 MG (50000 UNIT) PO CAPS: 50000.0000 [IU] | ORAL_CAPSULE | ORAL | 0 refills | Status: DC

## 2024-01-06 NOTE — Progress Notes (Signed)
 WEIGHT SUMMARY AND BIOMETRICS  Weight Lost Since Last Visit: 0  Weight Gained Since Last Visit: 3lb   Vitals Temp: (!) 97.4 F (36.3 C) BP: 126/79 Pulse Rate: 76 SpO2: 98 %   Anthropometric Measurements Height: 5' 8 (1.727 m) Weight: 260 lb (117.9 kg) BMI (Calculated): 39.54 Weight at Last Visit: 257lb Weight Lost Since Last Visit: 0 Weight Gained Since Last Visit: 3lb Starting Weight: 261lb Total Weight Loss (lbs): 1 lb (0.454 kg)   Body Composition  Body Fat %: 39.4 % Fat Mass (lbs): 102.4 lbs Muscle Mass (lbs): 149.8 lbs Total Body Water (lbs): 115.8 lbs Visceral Fat Rating : 25   Other Clinical Data Fasting: no Labs: no Today's Visit #: 5 Starting Date: 08/19/23    OBESITY Jerry Griffith is here to discuss his progress with his obesity treatment plan along with follow-up of his obesity related diagnoses.    Nutrition Plan: the Category 3 plan - 80% adherence.  Current exercise:  Stretches.   Interim History:  He is up 3 lbs since his last visit over the holidays.  Eating all of the food on the plan., Protein intake is as prescribed, Not journaling consistently., and Water intake is adequate.   Pharmacotherapy: Jerry Griffith is on Ozempic 1 mg SQ weekly Adverse side effects: Constipation and some mild nausea. Hunger is moderately controlled.  Cravings are moderately controlled.  Assessment/Plan:   Type II Diabetes HgbA1c is not at goal. Last A1c was 7.3. HE is using the Dexcom, but not consistently.  CBGs: Not checking      Episodes of hypoglycemia: no Medication(s): Ozempic 1 mg SQ weekly  Lab Results  Component Value Date   HGBA1C 7.3 (H) 08/19/2023   HGBA1C 7.6 (H) 04/11/2023   HGBA1C 7.1 (H) 11/12/2022   Lab Results  Component Value Date   LDLCALC 91 08/19/2023   CREATININE 0.86 08/19/2023   Lab Results  Component Value Date    GFR 93.12 04/11/2023   GFR 97.84 11/12/2022   GFR 98.37 09/10/2022    Plan: Continue Ozempic 1 mg SQ weekly He will try some Papaya enzymes for some mild GI discomfort related to the Ozempic.  He will increase his water to counter any constipation related to Ozempic.  Continue all other medications.  Will keep all carbohydrates low both sweets and starches.  Will continue exercise regimen to 30 to 60 minutes on most days of the week.  Aim for 7 to 9 hours of sleep nightly.  Eat more low glycemic index foods.   Vitamin D  Deficiency Vitamin D  is not at goal of 50.  Most recent vitamin D  level was 23.8. He is on  prescription ergocalciferol  50,000 IU weekly. Lab Results  Component Value Date   VD25OH 23.8 (L) 08/19/2023    Plan: Refill prescription vitamin D  50,000 IU weekly.       Morbid Obesity:  Current BMI BMI (Calculated): 39.54   Pharmacotherapy Plan Continue  Ozempic 1 mg SQ weekly  Jerry Griffith is currently in the action stage of change. As such, his goal is to continue with weight loss efforts.  He has agreed to the Category 3 plan.  Exercise goals: All adults should avoid inactivity. Some physical activity is better than none, and adults who participate in any amount of physical activity gain some health benefits. He is resting his knee and will be doing therapy.   Behavioral modification strategies: increasing lean protein intake, no meal skipping, decrease eating out, meal planning , increase water intake, better snacking choices, planning for success, avoiding temptations, and mindful eating.  Jerry Griffith has agreed to follow-up with our clinic in 4 weeks.       Objective:   VITALS: Per patient if applicable, see vitals. GENERAL: Alert and in no acute distress. CARDIOPULMONARY: No increased WOB. Speaking in clear sentences.  PSYCH: Pleasant and cooperative. Speech normal rate and rhythm. Affect is appropriate. Insight and judgement are appropriate. Attention is  focused, linear, and appropriate.  NEURO: Oriented as arrived to appointment on time with no prompting.   Attestation Statements:     This was prepared with the assistance of Engineer, Civil (consulting).  Occasional wrong-word or sound-a-like substitutions may have occurred due to the inherent limitations of voice recognition   Clayborne Daring, DO

## 2024-02-03 ENCOUNTER — Ambulatory Visit: Payer: 59 | Admitting: Bariatrics

## 2024-02-10 ENCOUNTER — Encounter: Payer: Self-pay | Admitting: Bariatrics

## 2024-02-10 ENCOUNTER — Ambulatory Visit: Payer: 59 | Admitting: Bariatrics

## 2024-02-10 VITALS — BP 142/88 | HR 93 | Temp 97.8°F | Ht 68.0 in | Wt 255.0 lb

## 2024-02-10 DIAGNOSIS — E119 Type 2 diabetes mellitus without complications: Secondary | ICD-10-CM | POA: Diagnosis not present

## 2024-02-10 DIAGNOSIS — E669 Obesity, unspecified: Secondary | ICD-10-CM | POA: Diagnosis not present

## 2024-02-10 DIAGNOSIS — Z7985 Long-term (current) use of injectable non-insulin antidiabetic drugs: Secondary | ICD-10-CM

## 2024-02-10 DIAGNOSIS — E66812 Obesity, class 2: Secondary | ICD-10-CM

## 2024-02-10 DIAGNOSIS — Z6838 Body mass index (BMI) 38.0-38.9, adult: Secondary | ICD-10-CM | POA: Diagnosis not present

## 2024-02-10 DIAGNOSIS — E1169 Type 2 diabetes mellitus with other specified complication: Secondary | ICD-10-CM

## 2024-02-10 DIAGNOSIS — E782 Mixed hyperlipidemia: Secondary | ICD-10-CM

## 2024-02-10 DIAGNOSIS — E1110 Type 2 diabetes mellitus with ketoacidosis without coma: Secondary | ICD-10-CM

## 2024-02-10 MED ORDER — VITAMIN D (ERGOCALCIFEROL) 1.25 MG (50000 UNIT) PO CAPS: 50000.0000 [IU] | ORAL_CAPSULE | ORAL | 0 refills | Status: DC

## 2024-02-10 NOTE — Progress Notes (Signed)
 WEIGHT SUMMARY AND BIOMETRICS  Weight Lost Since Last Visit: 5lb  Weight Gained Since Last Visit: 0lb   Vitals Temp: 97.8 F (36.6 C) BP: (!) 142/88 Pulse Rate: 93 SpO2: 99 %   Anthropometric Measurements Height: 5\' 8"  (1.727 m) Weight: 255 lb (115.7 kg) BMI (Calculated): 38.78 Weight at Last Visit: 260lb Weight Lost Since Last Visit: 5lb Weight Gained Since Last Visit: 0lb Starting Weight: 261lb Total Weight Loss (lbs): 6 lb (2.722 kg)   Body Composition  Body Fat %: 38.2 % Fat Mass (lbs): 97.4 lbs Muscle Mass (lbs): 150 lbs Total Body Water (lbs): 112.6 lbs Visceral Fat Rating : 24   Other Clinical Data Fasting: No Labs: No Today's Visit #: 6 Starting Date: 08/19/23    OBESITY Jerry Griffith is here to discuss his progress with his obesity treatment plan along with follow-up of his obesity related diagnoses.    Nutrition Plan: the Category 3 plan - 70-80% adherence.  Current exercise: none  Interim History: He is down 5 lbs since his last visit.  Eating all of the food on the plan., Protein intake is as prescribed, and Water intake is adequate.   Pharmacotherapy: Jerry Griffith had been on 2 types of insulin but has now stopped. Taking Ozempic Adverse side effects: Nausea, but improved.  Hunger is moderately controlled.  Cravings are moderately controlled.  Assessment/Plan:   Type II Diabetes HgbA1c is not at goal. Last A1c was 7.3 he previously took insulin both long-acting and shorter acting but is now off both insulins and is only on Ozempic.  CBGs: Fasting improving slowly        Episodes of hypoglycemia: no Medication(s): Ozempic 1 mg SQ weekly  Lab Results  Component Value Date   HGBA1C 7.3 (H) 08/19/2023   HGBA1C 7.6 (H) 04/11/2023   HGBA1C 7.1 (H) 11/12/2022   Lab Results  Component Value Date   LDLCALC 91 08/19/2023   CREATININE  0.86 08/19/2023   Lab Results  Component Value Date   GFR 93.12 04/11/2023   GFR 97.84 11/12/2022   GFR 98.37 09/10/2022    Plan: Continue Ozempic 1 mg SQ weekly Continue all other medications.  Will keep all carbohydrates low both sweets and starches.  Will continue exercise regimen to 30 to 60 minutes on most days of the week.  Aim for 7 to 9 hours of sleep nightly.  Eat more low glycemic index foods.  He will continue to work on his portion size using his small plate.  He will continue to drink his water and to not drink sugary drinks. He is also working on his sleep hygiene.  Hyperlipidemia ( mixed)  LDL is not at goal. Medication(s): Lipitor Cardiovascular risk factors: advanced age (older than 11 for men, 16 for women), diabetes mellitus, dyslipidemia, hypertension, male gender, obesity (BMI >= 30 kg/m2), and sedentary lifestyle  Lab Results  Component Value Date   CHOL 163 08/19/2023   HDL 41 08/19/2023   LDLCALC 91 08/19/2023   TRIG 177 (H) 08/19/2023   CHOLHDL 4 04/11/2023   Lab Results  Component Value Date   ALT 32 08/19/2023   AST 19 08/19/2023   ALKPHOS 113 08/19/2023   BILITOT 0.5 08/19/2023   The 10-year ASCVD risk score (Arnett DK, et al., 2019) is: 36.6%   Values used to calculate the score:     Age: 63 years     Sex: Male     Is Non-Hispanic African American: No     Diabetic: Yes     Tobacco smoker: Yes     Systolic Blood Pressure: 142 mmHg     Is BP treated: Yes     HDL Cholesterol: 41 mg/dL     Total Cholesterol: 163 mg/dL  Plan:  Continue statin.  Information sheet on healthy vs unhealthy fats.  Will avoid all trans fats.  Will read labels Will minimize saturated fats except the following: low fat meats in moderation, diary, and limited dark chocolate.  Will continue to increase his protein his water and minimize his carbohydrates   Generalized Obesity: Current BMI BMI (Calculated): 38.78   Pharmacotherapy Plan Continue  Ozempic 1 mg  SQ weekly  Dyland is currently in the action stage of change. As such, his goal is to continue with weight loss efforts.  He has agreed to the Category 3 plan.  Exercise goals: All adults should avoid inactivity. Some physical activity is better than none, and adults who participate in any amount of physical activity gain some health benefits. He is doing PT for his knee.   Behavioral modification strategies: increasing lean protein intake, no meal skipping, decrease eating out, meal planning , increasing fiber rich foods, keep healthy foods in the home, and measure portion sizes.  Javion has agreed to follow-up with our clinic in 4 weeks.      Objective:   VITALS: Per patient if applicable, see vitals. GENERAL: Alert and in no acute distress. CARDIOPULMONARY: No increased WOB. Speaking in clear sentences.  PSYCH: Pleasant and cooperative. Speech normal rate and rhythm. Affect is appropriate. Insight and judgement are appropriate. Attention is focused, linear, and appropriate.  NEURO: Oriented as arrived to appointment on time with no prompting.   Attestation Statements:    This was prepared with the assistance of Engineer, civil (consulting).  Occasional wrong-word or sound-a-like substitutions may have occurred due to the inherent limitations of voice recognition    Jerry Capra, DO

## 2024-02-22 ENCOUNTER — Encounter (HOSPITAL_BASED_OUTPATIENT_CLINIC_OR_DEPARTMENT_OTHER): Payer: Self-pay | Admitting: Emergency Medicine

## 2024-02-22 ENCOUNTER — Emergency Department (HOSPITAL_BASED_OUTPATIENT_CLINIC_OR_DEPARTMENT_OTHER)

## 2024-02-22 ENCOUNTER — Emergency Department (HOSPITAL_BASED_OUTPATIENT_CLINIC_OR_DEPARTMENT_OTHER)
Admission: EM | Admit: 2024-02-22 | Discharge: 2024-02-22 | Disposition: A | Discharge status: Home / Self Care | Attending: Emergency Medicine | Admitting: Emergency Medicine

## 2024-02-22 DIAGNOSIS — E669 Obesity, unspecified: Secondary | ICD-10-CM | POA: Diagnosis not present

## 2024-02-22 DIAGNOSIS — R072 Precordial pain: Secondary | ICD-10-CM | POA: Insufficient documentation

## 2024-02-22 DIAGNOSIS — I10 Essential (primary) hypertension: Secondary | ICD-10-CM | POA: Insufficient documentation

## 2024-02-22 DIAGNOSIS — Z794 Long term (current) use of insulin: Secondary | ICD-10-CM | POA: Diagnosis not present

## 2024-02-22 DIAGNOSIS — E119 Type 2 diabetes mellitus without complications: Secondary | ICD-10-CM | POA: Insufficient documentation

## 2024-02-22 DIAGNOSIS — Z79899 Other long term (current) drug therapy: Secondary | ICD-10-CM | POA: Insufficient documentation

## 2024-02-22 DIAGNOSIS — Z6838 Body mass index (BMI) 38.0-38.9, adult: Secondary | ICD-10-CM | POA: Insufficient documentation

## 2024-02-22 LAB — BASIC METABOLIC PANEL
Anion gap: 9 (ref 5–15)
BUN: 18 mg/dL (ref 8–23)
CO2: 26 mmol/L (ref 22–32)
Calcium: 9.3 mg/dL (ref 8.9–10.3)
Chloride: 101 mmol/L (ref 98–111)
Creatinine, Ser: 0.94 mg/dL (ref 0.61–1.24)
GFR, Estimated: >60 mL/min (ref >60)
Glucose, Bld: 114 mg/dL — ABNORMAL HIGH (ref 70–99)
Potassium: 3.6 mmol/L (ref 3.5–5.1)
Sodium: 136 mmol/L (ref 135–145)

## 2024-02-22 LAB — CBC WITH DIFFERENTIAL/PLATELET
Abs Immature Granulocytes: 0.06 10*3/uL (ref 0.00–0.07)
Basophils Absolute: 0 10*3/uL (ref 0.0–0.1)
Basophils Relative: 0 %
Eosinophils Absolute: 0.1 10*3/uL (ref 0.0–0.5)
Eosinophils Relative: 2 %
HCT: 43.7 % (ref 39.0–52.0)
Hemoglobin: 14.2 g/dL (ref 13.0–17.0)
Immature Granulocytes: 1 %
Lymphocytes Relative: 18 %
Lymphs Abs: 1.4 10*3/uL (ref 0.7–4.0)
MCH: 29.5 pg (ref 26.0–34.0)
MCHC: 32.5 g/dL (ref 30.0–36.0)
MCV: 90.9 fL (ref 80.0–100.0)
Monocytes Absolute: 0.7 10*3/uL (ref 0.1–1.0)
Monocytes Relative: 9 %
Neutro Abs: 5.5 10*3/uL (ref 1.7–7.7)
Neutrophils Relative %: 70 %
Platelets: 280 10*3/uL (ref 150–400)
RBC: 4.81 MIL/uL (ref 4.22–5.81)
RDW: 12.7 % (ref 11.5–15.5)
WBC: 7.8 10*3/uL (ref 4.0–10.5)
nRBC: 0 % (ref 0.0–0.2)

## 2024-02-22 LAB — TROPONIN I (HIGH SENSITIVITY)
Troponin I (High Sensitivity): 4 ng/L (ref <18)
Troponin I (High Sensitivity): 3 ng/L (ref <18)

## 2024-02-22 MED ORDER — LORAZEPAM 0.5 MG PO TABS
0.5000 mg | ORAL_TABLET | Freq: Two times a day (BID) | ORAL | 0 refills | Status: DC | PRN
Start: 2024-02-22 — End: 2024-04-27

## 2024-02-22 MED ORDER — ASPIRIN 81 MG PO CHEW
324.0000 mg | CHEWABLE_TABLET | Freq: Once | ORAL | Status: AC
  Administered 2024-02-22: 324 mg via ORAL
  Filled 2024-02-22: qty 4

## 2024-02-22 NOTE — ED Triage Notes (Signed)
 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

## 2024-02-22 NOTE — ED Provider Notes (Signed)
  Provider Note MRN:  [id number]  Arrival date & time: 02/22/24    ED Course and Medical Decision Making  Assumed care from Dr Criss Alvine at shift change.  See note from prior team for complete details, in brief:  63 year old male with history hypertension, HLD, obesity here with chest pain onset today while at work Pressure sensation cramping sensation  Plan per prior physician follow-up labs and imaging  Labs reviewed, stable, troponin negative x 2.  X-ray nonacute.  He is feeling much better on recheck.  Reports increased anxiety a/w his profession, trouble sleeping; will give short course of prn ativan for home, f/u pcp  Reasonable to have him f/u with cardiology   The patient's chest pain is not suggestive of pulmonary embolus, cardiac ischemia, aortic dissection, pericarditis, myocarditis, pulmonary embolism, pneumothorax, pneumonia, Zoster, or esophageal perforation, or other serious etiology.  Historically not abrupt in onset, tearing or ripping, pulses symmetric. EKG nonspecific for ischemia/infarction. No dysrhythmias, brugada, WPW, prolonged QT noted.   Troponin negative x2. CXR reviewed. Labs without demonstration of acute pathology unless otherwise noted above. Low HEART Score: 0-3 points (0.9-1.7% risk of MACE).  Given the extremely low risk of these diagnoses further testing and evaluation for these possibilities does not appear to be indicated at this time. Patient in no distress and overall condition improved here in the ED. Detailed discussions were had with the patient regarding current findings, and need for close f/u with PCP or on call doctor. The patient has been instructed to return immediately if the symptoms worsen in any way for re-evaluation. Patient verbalized understanding and is in agreement with current care plan. All questions answered prior to discharge.     Procedures  Final Clinical Impressions(s) / ED Diagnoses     ICD-10-CM   1. Precordial pain   R07.2 Ambulatory referral to Cardiology      ED Discharge Orders          Ordered    Ambulatory referral to Cardiology        02/22/24 1828    LORazepam (ATIVAN) 0.5 MG tablet  2 times daily PRN        02/22/24 1828              Discharge Instructions      Return to the Emergency Department if you have unusual chest pain, pressure, or discomfort, shortness of breath, nausea, vomiting, burping, heartburn, tingling upper body parts, sweating, cold, clammy skin, or racing heartbeat. Call 911 if you think you are having a heart attack.  Follow cardiac diet - avoid fatty & fried foods, don't eat too much red meat, eat lots of fruits & vegetables, and dairy products should be low fat. Please lose weight if you are overweight. Become more active with walking, gardening, or any other activity that gets you to moving.   Please return to the emergency department immediately for any new or concerning symptoms, or if you get worse.       Sloan Leiter, DO 02/22/24 [phone]

## 2024-02-22 NOTE — Discharge Instructions (Signed)
Return to the Emergency Department if you have unusual chest pain, pressure, or discomfort, shortness of breath, nausea, vomiting, burping, heartburn, tingling upper body parts, sweating, cold, clammy skin, or racing heartbeat. Call 911 if you think you are having a heart attack. Follow cardiac diet - avoid fatty & fried foods, don't eat too much red meat, eat lots of fruits & vegetables, and dairy products should be low fat. Please lose weight if you are overweight. Become more active with walking, gardening, or any other activity that gets you to moving.   Please return to the emergency department immediately for any new or concerning symptoms, or if you get worse. 

## 2024-02-22 NOTE — ED Notes (Signed)
 Patient transported to X-ray

## 2024-02-22 NOTE — ED Provider Notes (Signed)
 [place] [facility] [place] Provider Note   CSN: [id number] Arrival date & time: 02/22/24  1349     History  Chief Complaint  Patient presents with   Chest Pain   Shortness of Breath    Jerry Griffith is a 63 y.o. male.  HPI 63 year old male with a history of diabetes, hypertension, hyperlipidemia presents with chest pain.  Symptoms started around 115 while at work.  He was not doing anything particularly strenuous.  Feels like a pressure/cramping across his entire chest.  Does not radiate into his back or abdomen.  He felt some discomfort in his left shoulder/arm on the way here.  The symptoms are improved but still moderate in intensity.  Nothing makes the symptoms better or worse.  He has gotten some milder episodes of chest pain that seem to go away when he drinks some water in the past but that has not helped at this time. No new leg swelling.  Home Medications Prior to Admission medications   Medication Sig Start Date End Date Taking? Authorizing Provider  atorvastatin (LIPITOR) 20 MG tablet Take 1 tablet (20 mg total) by mouth daily. 04/11/23   Saguier, Ramon Dredge, PA-C  blood glucose meter kit and supplies KIT Dispense based on patient and insurance preference. Use up to four times daily as directed. 08/07/22   Noralee Stain, DO  chlorthalidone (HYGROTON) 25 MG tablet Take 1 tablet (25 mg total) by mouth daily. 04/08/23   Saguier, Ramon Dredge, PA-C  cholecalciferol (VITAMIN D3) 25 MCG (1000 UNIT) tablet Take 1,000 Units by mouth daily.    [provider]  COVID-19 mRNA vaccine [phone] (COMIRNATY) syringe Inject into the muscle. 11/12/22   Judyann Munson, MD  fluticasone (FLONASE) 50 MCG/ACT nasal spray Place 1 spray into both nostrils daily as needed for allergies.    [provider]  Insulin Pen Needle (PEN NEEDLES 3/16") 31G X 5 MM MISC 1 each by Does not apply route 4 (four) times daily. 08/07/22   Noralee Stain, DO   l-methylfolate-B6-B12 (METANX) 3-35-2 MG TABS tablet Take 1 tablet by mouth daily. 04/08/23   Saguier, Ramon Dredge, PA-C  losartan (COZAAR) 50 MG tablet Take 1 tablet (50 mg total) by mouth daily. 04/08/23   Saguier, Ramon Dredge, PA-C  Multiple Vitamin (MULTIVITAMIN) capsule Take 1 capsule by mouth daily.    [provider]  mupirocin ointment (BACTROBAN) 2 % Apply 1 Application topically 2 (two) times daily. 11/05/22   Saguier, Ramon Dredge, PA-C  Omega-3 1000 MG CAPS Take 1 g by mouth daily.    [provider]  Omeprazole (KLS OMPERAZOLE) 20 MG TBEC Take 1 tablet (20 mg total) by mouth 2 (two) times daily. 12/09/23   Corinna Capra A, DO  OZEMPIC, 0.25 OR 0.5 MG/DOSE, 2 MG/3ML SOPN Inject 2 mg into the skin. Into the skin weekly    [provider]  vitamin B-12 (CYANOCOBALAMIN) 100 MCG tablet Take 100 mcg by mouth daily.    [provider]  vitamin C (ASCORBIC ACID) 250 MG tablet Take 250 mg by mouth daily.    [provider]  Vitamin D, Ergocalciferol, (DRISDOL) 1.25 MG (50000 UNIT) CAPS capsule Take 1 capsule (50,000 Units total) by mouth every 7 (seven) days. 02/10/24   Roswell Nickel, DO      Allergies    Patient has no known allergies.    Review of Systems   Review of Systems  Constitutional:  Negative for fever.  Respiratory:  Positive for shortness of  breath. Negative for cough.   Cardiovascular:  Positive for chest pain.  Gastrointestinal:  Negative for abdominal pain.  Musculoskeletal:  Negative for back pain.    Physical Exam Updated Vital Signs BP 110/69 (BP Location: Right Arm)   Pulse 92   Temp 98.4 F (36.9 C)   Resp 13   Ht 5\' 8"  (1.727 m)   Wt 115.7 kg   SpO2 99%   BMI 38.78 kg/m  Physical Exam Vitals and nursing note reviewed.  Constitutional:      General: He is not in acute distress.    Appearance: He is well-developed. He is obese. He is not ill-appearing or diaphoretic.  HENT:     Head: Normocephalic and atraumatic.   Cardiovascular:     Rate and Rhythm: Normal rate and regular rhythm.     Heart sounds: Normal heart sounds.  Pulmonary:     Effort: Pulmonary effort is normal.     Breath sounds: Normal breath sounds.  Abdominal:     Palpations: Abdomen is soft.     Tenderness: There is no abdominal tenderness.  Skin:    General: Skin is warm and dry.  Neurological:     Mental Status: He is alert.     ED Results / Procedures / Treatments   Labs (all labs ordered are listed, but only abnormal results are displayed) Labs Reviewed  BASIC METABOLIC PANEL  CBC WITH DIFFERENTIAL/PLATELET  TROPONIN I (HIGH SENSITIVITY)    EKG EKG Interpretation Date/Time:  Saturday February 22 2024 13:57:32 EST Ventricular Rate:  90 PR Interval:  153 QRS Duration:  79 QT Interval:  343 QTC Calculation: 420 R Axis:   21  Text Interpretation: Sinus rhythm Atrial premature complex Low voltage, precordial leads Abnormal R-wave progression, early transition Confirmed by Pricilla Loveless [phone]) on 02/22/2024 2:06:37 PM  Radiology No results found.  Procedures Procedures    Medications Ordered in ED Medications  aspirin chewable tablet 324 mg (has no administration in time range)    ED Course/ Medical Decision Making/ A&P                                 Medical Decision Making Amount and/or Complexity of Data Reviewed Labs: ordered. Radiology: ordered. ECG/medicine tests: ordered and independent interpretation performed.    Details: No ischemia  Risk OTC drugs.   Patient with chest pain starting about an hour prior to arrival.  ECGs do not show obvious ischemia/STEMI.  Labs/chest x-ray currently pending.  Will give some aspirin.  Care transferred to Dr. Wallace Cullens.        Final Clinical Impression(s) / ED Diagnoses Final diagnoses:  None    Rx / DC Orders ED Discharge Orders     None         Pricilla Loveless, MD 02/22/24 1520

## 2024-02-29 ENCOUNTER — Other Ambulatory Visit: Payer: Self-pay | Admitting: Bariatrics

## 2024-02-29 DIAGNOSIS — K2100 Gastro-esophageal reflux disease with esophagitis, without bleeding: Secondary | ICD-10-CM

## 2024-03-02 ENCOUNTER — Other Ambulatory Visit: Payer: Self-pay | Admitting: Bariatrics

## 2024-03-02 ENCOUNTER — Encounter: Payer: Self-pay | Admitting: Bariatrics

## 2024-03-02 DIAGNOSIS — K2100 Gastro-esophageal reflux disease with esophagitis, without bleeding: Secondary | ICD-10-CM

## 2024-03-02 MED ORDER — OMEPRAZOLE 20 MG PO TBEC: 1.0000 | DELAYED_RELEASE_TABLET | Freq: Two times a day (BID) | ORAL | 0 refills | Status: DC

## 2024-03-02 NOTE — Telephone Encounter (Signed)
 LAST APPOINTMENT DATE: 02/09/2014 NEXT APPOINTMENT DATE: 03/23/2024   [facility] # [id number] - [address] [place], [place] - [address] [facility] [address] [place] [address] Phone: [phone] Fax: [phone]  [id number] - [place], [place] - [address] AT [facility] [address] [place] [address] Phone: [phone] Fax: [phone]  Patient is requesting a refill of the following medications: Requested Prescriptions   Pending Prescriptions Disp Refills   KLS OMPERAZOLE 20 MG TBEC [Pharmacy Med Name: KLS Omeprazole Oral Tablet Delayed Release 20 MG] 168 tablet 0    Sig: TAKE ONE TABLET BY MOUTH TWICE DAILY    Date last filled: 12/09/2023 Previously prescribed by Dr Manson Passey  Lab Results  Component Value Date   HGBA1C 7.3 (H) 08/19/2023   HGBA1C 7.6 (H) 04/11/2023   HGBA1C 7.1 (H) 11/12/2022   Lab Results  Component Value Date   LDLCALC 91 08/19/2023   CREATININE 0.94 02/22/2024   Lab Results  Component Value Date   VD25OH 23.8 (L) 08/19/2023    BP Readings from Last 3 Encounters:  02/22/24 118/66  02/10/24 (!) 142/88  01/06/24 126/79

## 2024-03-21 ENCOUNTER — Other Ambulatory Visit: Payer: Self-pay | Admitting: Medical

## 2024-03-21 ENCOUNTER — Other Ambulatory Visit: Payer: Self-pay | Admitting: Bariatrics

## 2024-03-21 DIAGNOSIS — K2100 Gastro-esophageal reflux disease with esophagitis, without bleeding: Secondary | ICD-10-CM

## 2024-03-23 ENCOUNTER — Ambulatory Visit: Payer: 59 | Admitting: Bariatrics

## 2024-03-23 ENCOUNTER — Encounter: Payer: Self-pay | Admitting: Bariatrics

## 2024-03-23 VITALS — BP 109/68 | HR 89 | Temp 98.2°F | Ht 68.0 in | Wt 252.0 lb

## 2024-03-23 DIAGNOSIS — F5089 Other specified eating disorder: Secondary | ICD-10-CM | POA: Diagnosis not present

## 2024-03-23 DIAGNOSIS — E559 Vitamin D deficiency, unspecified: Secondary | ICD-10-CM | POA: Diagnosis not present

## 2024-03-23 DIAGNOSIS — E669 Obesity, unspecified: Secondary | ICD-10-CM

## 2024-03-23 DIAGNOSIS — E119 Type 2 diabetes mellitus without complications: Secondary | ICD-10-CM

## 2024-03-23 DIAGNOSIS — E1110 Type 2 diabetes mellitus with ketoacidosis without coma: Secondary | ICD-10-CM

## 2024-03-23 DIAGNOSIS — Z6838 Body mass index (BMI) 38.0-38.9, adult: Secondary | ICD-10-CM

## 2024-03-23 DIAGNOSIS — Z7985 Long-term (current) use of injectable non-insulin antidiabetic drugs: Secondary | ICD-10-CM

## 2024-03-23 MED ORDER — BUPROPION HCL ER (SR) 150 MG PO TB12: 150.0000 mg | ORAL_TABLET | Freq: Every day | ORAL | 0 refills | Status: DC

## 2024-03-23 MED ORDER — VITAMIN D (ERGOCALCIFEROL) 1.25 MG (50000 UNIT) PO CAPS: 50000.0000 [IU] | ORAL_CAPSULE | ORAL | 0 refills | Status: DC

## 2024-03-23 NOTE — Progress Notes (Signed)
 WEIGHT SUMMARY AND BIOMETRICS  Weight Lost Since Last Visit: 3lb  Weight Gained Since Last Visit: 0   Vitals Temp: 98.2 F (36.8 C) BP: 109/68 Pulse Rate: 89 SpO2: 100 %   Anthropometric Measurements Height: 5\' 8"  (1.727 m) Weight: 252 lb (114.3 kg) BMI (Calculated): 38.33 Weight at Last Visit: 255lb Weight Lost Since Last Visit: 3lb Weight Gained Since Last Visit: 0 Starting Weight: 261lb Total Weight Loss (lbs): 9 lb (4.082 kg)   Body Composition  Body Fat %: 37.8 % Fat Mass (lbs): 95.6 lbs Muscle Mass (lbs): 149.4 lbs Total Body Water (lbs): 110.8 lbs Visceral Fat Rating : 24   Other Clinical Data Fasting: no Labs: no Today's Visit #: 7 Starting Date: 08/19/23    OBESITY Myrle is here to discuss his progress with his obesity treatment plan along with follow-up of his obesity related diagnoses.    Nutrition Plan: the Category 3 plan - 80% adherence.  Current exercise: none  Interim History:  He is down another 3 lbs since his last visit.  Eating all of the food on the plan., Protein intake is as prescribed, Is not drinking sugar sweetened beverages., Is not skipping meals, Not journaling consistently., and Water intake is adequate.   Pharmacotherapy: Hanif is on Ozempic 1 mg SQ weekly Adverse side effects: None Hunger is moderately controlled.  Cravings are moderately controlled.  Assessment/Plan:   Vitamin D Deficiency Vitamin D is not at goal of 50.  Most recent vitamin D level was 23.8. He is on  prescription ergocalciferol 50,000 IU weekly. Lab Results  Component Value Date   VD25OH 23.8 (L) 08/19/2023    Plan: Refill prescription vitamin D 50,000 IU weekly.      Type II Diabetes HgbA1c is at goal. Last A1c was 7.3 CBGs: Fasting 100 to 150, occasionally in the 80's        Episodes of hypoglycemia: no Medication(s):  Ozempic 1 mg SQ weekly  Lab Results  Component Value Date   HGBA1C 7.3 (H) 08/19/2023   HGBA1C 7.6 (H) 04/11/2023   HGBA1C 7.1 (H) 11/12/2022   Lab Results  Component Value Date   LDLCALC 91 08/19/2023   CREATININE 0.94 02/22/2024   Lab Results  Component Value Date   GFR 93.12 04/11/2023   GFR 97.84 11/12/2022   GFR 98.37 09/10/2022    Plan: Continue Ozempic 1 mg SQ weekly Continue all other medications.  Will keep all carbohydrates low both sweets and starches.  Will continue exercise regimen to 30 to 60 minutes on most days of the week.  Aim for 7 to 9 hours of sleep nightly.  Eat more low glycemic index foods.  He will buy some glucose tablets. He will not skip meals.  He will continue to monitor his blood glucose levels with his transdermal glucose monitor.   Eating disorder/emotional eating Rennie has had  issues with stress eating and boredom eating. He also has some mild depression related to his job and other issues.  Currently this is moderately controlled. Overall mood is stable. Denies suicidal/homicidal ideation. Medication(s): Will start Wellbutrin 150 mg daily in the am  Plan:  Motivational interviewing as well as evidence-based interventions for health behavior change were utilized today including the discussion of self monitoring techniques, problem-solving barriers and SMART goal setting techniques.  Consider a referral to Dr. Dewaine Conger, PhD psychologist to help with eating behaviors.  Discussed distractions to curb eating behaviors. Discussed activities to do with one's hands in the evening  Be sure to get adequate rest as lack of rest can trigger appetite.  Have plan in place for stressful events.  Consider other rewards besides food.    Generalized Obesity : Current BMI BMI (Calculated): 38.33  Pharmacotherapy Plan Continue  Ozempic 1 mg SQ weekly  Temitayo is currently in the action stage of change. As such, his goal is to continue with weight loss  efforts.  He has agreed to the Category 3 plan.  Exercise goals: All adults should avoid inactivity. Some physical activity is better than none, and adults who participate in any amount of physical activity gain some health benefits.  Behavioral modification strategies: increasing lean protein intake, no meal skipping, meal planning , better snacking choices, planning for success, increasing vegetables, avoiding temptations, keep healthy foods in the home, and mindful eating.  Murdock has agreed to follow-up with our clinic in 4 weeks.      Objective:   VITALS: Per patient if applicable, see vitals. GENERAL: Alert and in no acute distress. CARDIOPULMONARY: No increased WOB. Speaking in clear sentences.  PSYCH: Pleasant and cooperative. Speech normal rate and rhythm. Affect is appropriate. Insight and judgement are appropriate. Attention is focused, linear, and appropriate.  NEURO: Oriented as arrived to appointment on time with no prompting.   Attestation Statements:   This was prepared with the assistance of Engineer, civil (consulting).  Occasional wrong-word or sound-a-like substitutions may have occurred due to the inherent limitations of voice recognition   Corinna Capra, DO

## 2024-04-01 NOTE — Progress Notes (Unsigned)
 Cardiology Office Note:   Date:  04/01/2024  ID:  Jerry Griffith, DOB 02-16-1961, MRN [id number] PCP:  Marisue Brooklyn  [facility] Providers Cardiologist:  Alverda Skeans, MD Referring MD: Sloan Leiter, DO  Chief Complaint/Reason for Referral: Chest pain ASSESSMENT:    1. Precordial pain   2. Type 2 diabetes mellitus without complication, without long-term current use of insulin (HCC)   3. Hypertension associated with diabetes (HCC)   4. Hyperlipidemia associated with type 2 diabetes mellitus (HCC)   5. BMI 40.0-44.9, adult [facility])     PLAN:   In order of problems listed above: Chest pain:  We will obtain a coronary CTA and echocardiogram to evaluate further.  If the patient has mild obstructive coronary artery disease, they will require a statin (with goal LDL < 70) and aspirin, if they have high-grade disease we will need to consider optimal medical therapy and if symptoms are refractory to medical therapy, then a cardiac catheterization with possible PCI will be pursued to alleviate symptoms.  If they have high risk disease we will proceed directly to cardiac catheterization.   Type 2 diabetes mellitus: Start aspirin 81 mg, continue atorvastatin 20 mg, losartan 50 mg, start Jardiance 10 mg.*** Hypertension: Continue losartan 50 mg daily.*** Hyperlipidemia: LDL in August was 91; goal LDL with diabetes is less than 70.  Increase atorvastatin to 40 mg and check lipid panel, LFTs, LP(a) in 2 months.*** Elevated BMI: Continue Ozempic 2 mg q. weekly.        {Are you ordering a CV Procedure (e.g. stress test, cath, DCCV, TEE, etc)?   Press F2        :[id number]}   Dispo:  No follow-ups on file.      Medication Adjustments/Labs and Tests Ordered: Current medicines are reviewed at length with the patient today.  Concerns regarding medicines are outlined above.  The following changes have been made:  {PLAN; NO CHANGE:13088:s}   Labs/tests ordered: No orders of the defined  types were placed in this encounter.   Medication Changes: No orders of the defined types were placed in this encounter.   Current medicines are reviewed at length with the patient today.  The patient {ACTIONS; HAS/DOES NOT HAVE:19233} concerns regarding medicines.  I spent *** minutes reviewing all clinical data during and prior to this visit including all relevant imaging studies, laboratories, clinical information from other health systems and prior notes from both Cardiology and other specialties, interviewing the patient, conducting a complete physical examination, and coordinating care in order to formulate a comprehensive and personalized evaluation and treatment plan.   History of Present Illness:      FOCUSED PROBLEM LIST:   T2DM Not on insulin Hypertension Hyperlipidemia BMI 41  April 2025:  Patient consents to use of AI scribe.*** The patient is a 63 year old male with above listed medical problems here for emergency room hospital follow-up.  The patient presented to [facility] on March 1 with chest pressure without radiation.  The chest symptoms were getting better when he arrived in the emergency department.  Biomarkers were negative x 2.  EKGs were reassuring.  He was ultimately discharged home returns for further cardiac evaluation.          Current Medications: No outpatient medications have been marked as taking for the 04/03/24 encounter (Appointment) with Orbie Pyo, MD.     Review of Systems:   Please see the history of present illness.    All other systems reviewed  and are negative.     EKGs/Labs/Other Test Reviewed:   EKG: EKG from March 2025 demonstrates sinus rhythm  EKG Interpretation Date/Time:    Ventricular Rate:    PR Interval:    QRS Duration:    QT Interval:    QTC Calculation:   R Axis:      Text Interpretation:           Risk Assessment/Calculations:   {Does this patient have ATRIAL  FIBRILLATION?:[id number]}      Physical Exam:   VS:  There were no vitals taken for this visit.   No BP recorded.  {Refresh Note OR Click here to enter BP  :1}***   Wt Readings from Last 3 Encounters:  03/23/24 252 lb (114.3 kg)  02/22/24 255 lb 1.2 oz (115.7 kg)  02/10/24 255 lb (115.7 kg)      GENERAL:  No apparent distress, AOx3 HEENT:  No carotid bruits, +2 carotid impulses, no scleral icterus CAR: RRR Irregular RR*** no murmurs***, gallops, rubs, or thrills RES:  Clear to auscultation bilaterally ABD:  Soft, nontender, nondistended, positive bowel sounds x 4 VASC:  +2 radial pulses, +2 carotid pulses NEURO:  CN 2-12 grossly intact; motor and sensory grossly intact PSYCH:  No active depression or anxiety EXT:  No edema, ecchymosis, or cyanosis  Signed, Orbie Pyo, MD  04/01/2024 3:44 PM    [facility] [address] [place], [place], [place]  [address] Phone: [phone]; Fax: [phone]   Note:  This document was prepared using Dragon voice recognition software and may include unintentional dictation errors.

## 2024-04-03 ENCOUNTER — Other Ambulatory Visit: Payer: Self-pay | Admitting: *Deleted

## 2024-04-03 ENCOUNTER — Encounter: Payer: Self-pay | Admitting: Internal Medicine

## 2024-04-03 ENCOUNTER — Ambulatory Visit: Attending: Internal Medicine | Admitting: Internal Medicine

## 2024-04-03 VITALS — BP 108/60 | HR 94 | Ht 69.0 in | Wt 254.0 lb

## 2024-04-03 DIAGNOSIS — E119 Type 2 diabetes mellitus without complications: Secondary | ICD-10-CM | POA: Diagnosis not present

## 2024-04-03 DIAGNOSIS — Z6841 Body Mass Index (BMI) 40.0 and over, adult: Secondary | ICD-10-CM

## 2024-04-03 DIAGNOSIS — E785 Hyperlipidemia, unspecified: Secondary | ICD-10-CM

## 2024-04-03 DIAGNOSIS — R072 Precordial pain: Secondary | ICD-10-CM | POA: Diagnosis not present

## 2024-04-03 DIAGNOSIS — E1159 Type 2 diabetes mellitus with other circulatory complications: Secondary | ICD-10-CM

## 2024-04-03 DIAGNOSIS — E1169 Type 2 diabetes mellitus with other specified complication: Secondary | ICD-10-CM

## 2024-04-03 DIAGNOSIS — I152 Hypertension secondary to endocrine disorders: Secondary | ICD-10-CM

## 2024-04-03 MED ORDER — ATORVASTATIN CALCIUM 40 MG PO TABS: 40.0000 mg | ORAL_TABLET | Freq: Every day | ORAL | 3 refills | Status: DC

## 2024-04-03 MED ORDER — EMPAGLIFLOZIN 10 MG PO TABS: 10.0000 mg | ORAL_TABLET | Freq: Every day | ORAL | 5 refills | Status: DC

## 2024-04-03 MED ORDER — IVABRADINE HCL 5 MG PO TABS: 15.0000 mg | ORAL_TABLET | Freq: Once | ORAL | Status: AC

## 2024-04-03 MED ORDER — IVABRADINE HCL 7.5 MG PO TABS: 15.0000 mg | ORAL_TABLET | Freq: Once | ORAL | Status: DC

## 2024-04-03 NOTE — Patient Instructions (Addendum)
 Medication Instructions:  Your physician has recommended you make the following change in your medication:  1.) start Jardiance 10 mg - one tablet daily 2.) increase atorvastatin to 40 mg - one tablet daily   *If you need a refill on your cardiac medications before your next appointment, please call your pharmacy*  Lab Work: In about 8 weeks: lipids, liver, Lpa You may go to any of these [facility] locations:   [facility] - [address] ([facility]) - [phone] [address]   [place] - [address]    [place]  - [address]    [place] - [address] - [address] [employer]  - [address] [place] - [address] ([facility])  [place]   - [address]   Testing/Procedures: Your physician has requested that you have an echocardiogram. Echocardiography is a painless test that uses sound waves to create images of your heart. It provides your doctor with information about the size and shape of your heart and how well your heart's chambers and valves are working. This procedure takes approximately one hour. There are no restrictions for this procedure. Please do NOT wear cologne, perfume, aftershave, or lotions (deodorant is allowed). Please arrive 15 minutes prior to your appointment time.  Please note: We ask at that you not bring children with you during ultrasound (echo/ vascular) testing. Due to room size and safety concerns, children are not allowed in the ultrasound rooms during exams. Our front office staff cannot provide observation of children in our lobby area while testing is being conducted. An adult accompanying a patient to their appointment will only be allowed in the ultrasound room at the discretion of the ultrasound technician under special circumstances. We apologize for any inconvenience.  Coronary  CT Angio - see instructions below    Follow-Up: At [facility], you and your health needs are our priority.  As part of our continuing mission to provide you with exceptional heart care, our providers are all part of one team.  This team includes your primary Cardiologist (physician) and Advanced Practice Providers or APPs (Physician Assistants and Nurse Practitioners) who all work together to provide you with the care you need, when you need it.  Your next appointment:   6 month(s)  Provider:   Orbie Pyo, MD      Other Instructions   Your cardiac CT will be scheduled at one of the below locations:   [facility] [address] [place], [place] [address] [phone]  OR  [facility] [address] [place], [place] [address] [phone]  OR   [facility] [address] [place], [place] [address] [phone]  OR   [facility] [address] [place], [place] [address] [phone]  OR   [facility] and Vascular Tower [address] [place] [address]  [place], [place] [address] Opening April 20, 2024  If scheduled at [facility], please arrive at the [facility] and Children's Entrance (Entrance C2) of [facility] 30 minutes prior to test start time. You can use the FREE valet parking offered at entrance C (encouraged to control the heart rate for the test)  Proceed to the [facility] Department (first floor) to check-in and test prep.  All radiology patients and guests should use entrance C2 at [facility], accessed from [facility], even though the hospital's physical address listed is [address].    If scheduled at [facility] or [facility], please arrive 15 mins early for check-in and test prep.  There is spacious parking and easy access to the  radiology department from the [facility] V[facility] entrance. Please enter here and check-in with the desk attendant.   If scheduled at [facility], please arrive 30 minutes early for check-in and test prep.  Please follow these instructions carefully (unless otherwise directed):  An IV will be required for this test and Nitroglycerin will be given.  Hold all erectile dysfunction medications at least 3 days (72 hrs) prior to test. (Ie viagra, cialis, sildenafil, tadalafil, etc)   On the Night Before the Test: Be sure to Drink plenty of water. Do not consume any caffeinated/decaffeinated beverages or chocolate 12 hours prior to your test. Do not take any antihistamines 12 hours prior to your test.  On the Day of the Test: Drink plenty of water until 1 hour prior to the test. Do not eat any food 1 hour prior to test. You may take your regular medications prior to the test.  Take corlanor 15 mg two hours prior to test. If you take Hydrochlorothiazide please HOLD on the morning of the test.      After the Test: Drink plenty of water. After receiving IV contrast, you may experience a mild flushed feeling. This is normal. On occasion, you may experience a mild rash up to 24 hours after the test. This is not dangerous. If this occurs, you can take Benadryl 25 mg, Zyrtec, Claritin, or Allegra and increase your fluid intake. (Patients taking Tikosyn should avoid Benadryl, and may take Zyrtec, Claritin, or Allegra) If you experience trouble breathing, this can be serious. If it is severe call 911 IMMEDIATELY. If it is mild, please call our office.  We will call to schedule your test 2-4 weeks out understanding that some insurance companies will need an authorization prior to the service being performed.   For more information and frequently asked questions, please visit our website : [email or web address]  For non-scheduling related questions, please contact the cardiac  imaging nurse navigator should you have any questions/concerns: Cardiac Imaging Nurse Navigators Direct Office Dial: [phone]   For scheduling needs, including cancellations and rescheduling, please call [place], [phone].       1st Floor: - Lobby - Registration  - Pharmacy  - Lab - Cafe  2nd Floor: - PV Lab - Diagnostic Testing (echo, CT, nuclear med)  3rd Floor: - Vacant  4th Floor: - TCTS (cardiothoracic surgery) - AFib Clinic - Structural Heart Clinic - Vascular Surgery  - Vascular Ultrasound  5th Floor: - HeartCare Cardiology (general and EP) - Clinical Pharmacy for coumadin, hypertension, lipid, weight-loss medications, and med management appointments    Valet parking services will be available as well.

## 2024-04-20 ENCOUNTER — Ambulatory Visit: Admitting: Cardiology

## 2024-04-27 ENCOUNTER — Ambulatory Visit: Admitting: Bariatrics

## 2024-04-27 ENCOUNTER — Encounter: Payer: Self-pay | Admitting: Bariatrics

## 2024-04-27 VITALS — BP 112/76 | HR 77 | Temp 98.2°F | Ht 68.0 in | Wt 248.0 lb

## 2024-04-27 DIAGNOSIS — E559 Vitamin D deficiency, unspecified: Secondary | ICD-10-CM

## 2024-04-27 DIAGNOSIS — K2100 Gastro-esophageal reflux disease with esophagitis, without bleeding: Secondary | ICD-10-CM | POA: Diagnosis not present

## 2024-04-27 DIAGNOSIS — F430 Acute stress reaction: Secondary | ICD-10-CM

## 2024-04-27 DIAGNOSIS — Z6837 Body mass index (BMI) 37.0-37.9, adult: Secondary | ICD-10-CM

## 2024-04-27 DIAGNOSIS — F410 Panic disorder [episodic paroxysmal anxiety] without agoraphobia: Secondary | ICD-10-CM

## 2024-04-27 DIAGNOSIS — E669 Obesity, unspecified: Secondary | ICD-10-CM

## 2024-04-27 DIAGNOSIS — F5089 Other specified eating disorder: Secondary | ICD-10-CM

## 2024-04-27 MED ORDER — BUPROPION HCL ER (SR) 150 MG PO TB12: 150.0000 mg | ORAL_TABLET | Freq: Every day | ORAL | 0 refills | Status: DC

## 2024-04-27 MED ORDER — VITAMIN D (ERGOCALCIFEROL) 1.25 MG (50000 UNIT) PO CAPS: 50000.0000 [IU] | ORAL_CAPSULE | ORAL | 0 refills | Status: DC

## 2024-04-27 MED ORDER — OMEPRAZOLE 20 MG PO TBEC: 1.0000 | DELAYED_RELEASE_TABLET | Freq: Two times a day (BID) | ORAL | 0 refills | Status: DC

## 2024-04-27 MED ORDER — CLONAZEPAM 0.5 MG PO TABS: 0.5000 mg | ORAL_TABLET | Freq: Two times a day (BID) | ORAL | 0 refills | Status: AC | PRN

## 2024-04-27 NOTE — Progress Notes (Signed)
 WEIGHT SUMMARY AND BIOMETRICS  Weight Lost Since Last Visit: 4lb  Weight Gained Since Last Visit: 0   Vitals Temp: 98.2 F (36.8 C) BP: 112/76 Pulse Rate: 77 SpO2: 98 %   Anthropometric Measurements Height: 5\' 8"  (1.727 m) Weight: 248 lb (112.5 kg) BMI (Calculated): 37.72 Weight at Last Visit: 252lb Weight Lost Since Last Visit: 4lb Weight Gained Since Last Visit: 0 Starting Weight: 261lb Total Weight Loss (lbs): 13 lb (5.897 kg)   Body Composition  Body Fat %: 37.6 % Fat Mass (lbs): 93.4 lbs Muscle Mass (lbs): 147.4 lbs Total Body Water (lbs): 110.4 lbs Visceral Fat Rating : 23   Other Clinical Data Fasting: no Labs: no Today's Visit #: 8 Starting Date: 08/19/23    OBESITY Knoah is here to discuss his progress with his obesity treatment plan along with follow-up of his obesity related diagnoses.    Nutrition Plan: the Category 3 plan - 80% adherence.  Current exercise: none  Interim History:  He is down 4 lbs since his last visit.  Eating all of the food on the plan., Protein intake is as prescribed, and Water intake is adequate.   Pharmacotherapy: Marvis is on Ozempic 2 mg SQ weekly Adverse side effects: None Hunger is moderately controlled.  Cravings are moderately controlled.  Assessment/Plan:    Gastroesophageal reflux disease without esophagitis Symptoms are well-controlled. Medication: Omeprazole  20 mg 1 twice daily #60 with no refills  Plan: Continue PPI. Use antacids such as Tums or Mylanta as needed. May use OTC Pepcid along with PPI if needed. Avoid trigger foods (spicy foods, fatty/fried foods, acidic foods, coffee, alcohol). Avoid eating within 3 hours of bedtime. Continue to work on weight loss.   Eating disorder/emotional eating Vineet has had issues with stress eating and emotional eating. Currently this is  moderately controlled. Overall mood is stable. Denies suicidal/homicidal ideation. Medication(s): Wellbutrin  150 mg daily in the am  Plan:  Motivational interviewing as well as evidence-based interventions for health behavior change were utilized today including the discussion of self monitoring techniques, problem-solving barriers and SMART goal setting techniques.  Consider a referral to Dr. Delaine Favorite, PhD psychologist to help with eating behaviors.  Discussed distractions to curb eating behaviors. Discussed activities to do with one's hands in the evening  Be sure to get adequate rest as lack of rest can trigger appetite.  Have plan in place for stressful events.  Consider other rewards besides food.    Vitamin D  Deficiency Vitamin D  is not at goal of 50.  Most recent vitamin D  level was 23.8. He is on  prescription ergocalciferol  50,000 IU weekly. Lab Results  Component Value Date   VD25OH 23.8 (L) 08/19/2023    Plan: Refill prescription vitamin D  50,000 IU weekly.    Panic attack as a reaction to stress:   He has been under more stress lately.  He has had 2 episodes now where he felt slightly short of breath and really anxious.  His last episode was on Sunday.  He has been to the emergency room x 1 with some vague symptoms of shortness of breath and possible chest discomfort. He is not seeing a therapist or a psychologist at this time.  He is having a cardiac workup in the near future per his cardiologist.   Plan: He will continue his Wellbutrin  for his disordered eating as he had an episode before starting the Wellbutrin  and neither on nor he believes that the Wellbutrin  is causing his symptoms.  He will follow-up with his cardiologist. Rx: Clonazepam 0.5 mg twice daily as needed for panic attack/anxiety # 30 with no refills.     Generalized Obesity: Current BMI BMI (Calculated): 37.72   Pharmacotherapy Plan Continue  Ozempic 2 mg SQ weekly He has stopped his insulin  and is  also not using her freestyle libre as he is no longer on insulin .    Malaky is currently in the action stage of change. As such, his goal is to continue with weight loss efforts.  He has agreed to the Category 3 plan.  Exercise goals: All adults should avoid inactivity. Some physical activity is better than none, and adults who participate in any amount of physical activity gain some health benefits.  Behavioral modification strategies: increasing lean protein intake, no meal skipping, increase water intake, better snacking choices, planning for success, increasing vegetables, keep healthy foods in the home, and weigh protein portions.  Brixten has agreed to follow-up with our clinic in 4 weeks.      Objective:   VITALS: Per patient if applicable, see vitals. GENERAL: Alert and in no acute distress. CARDIOPULMONARY: No increased WOB. Speaking in clear sentences.  PSYCH: Pleasant and cooperative. Speech normal rate and rhythm. Affect is appropriate. Insight and judgement are appropriate. Attention is focused, linear, and appropriate.  NEURO: Oriented as arrived to appointment on time with no prompting.   Attestation Statements:    This was prepared with the assistance of Engineer, civil (consulting).  Occasional wrong-word or sound-a-like substitutions may have occurred due to the inherent limitations of voice recognition   Kirk Peper, DO

## 2024-05-11 ENCOUNTER — Ambulatory Visit (HOSPITAL_COMMUNITY)
Admission: RE | Admit: 2024-05-11 | Discharge: 2024-05-11 | Disposition: A | Discharge status: Home / Self Care | Source: Ambulatory Visit | Attending: Internal Medicine | Admitting: Internal Medicine

## 2024-05-11 DIAGNOSIS — I088 Other rheumatic multiple valve diseases: Secondary | ICD-10-CM | POA: Diagnosis not present

## 2024-05-11 DIAGNOSIS — R072 Precordial pain: Secondary | ICD-10-CM | POA: Diagnosis present

## 2024-05-11 LAB — ECHOCARDIOGRAM COMPLETE
Area-P 1/2: 2.97 cm2
S' Lateral: 2 cm

## 2024-05-12 ENCOUNTER — Ambulatory Visit: Payer: Self-pay | Admitting: Internal Medicine

## 2024-05-13 ENCOUNTER — Other Ambulatory Visit: Payer: Self-pay | Admitting: Bariatrics

## 2024-05-13 DIAGNOSIS — K2100 Gastro-esophageal reflux disease with esophagitis, without bleeding: Secondary | ICD-10-CM

## 2024-05-21 ENCOUNTER — Other Ambulatory Visit: Payer: Self-pay | Admitting: Bariatrics

## 2024-05-21 DIAGNOSIS — K2100 Gastro-esophageal reflux disease with esophagitis, without bleeding: Secondary | ICD-10-CM

## 2024-05-22 ENCOUNTER — Encounter (HOSPITAL_COMMUNITY): Payer: Self-pay

## 2024-05-22 ENCOUNTER — Telehealth (HOSPITAL_COMMUNITY): Payer: Self-pay | Admitting: Emergency Medicine

## 2024-05-22 NOTE — Telephone Encounter (Signed)
 Reaching out to patient to offer assistance regarding upcoming cardiac imaging study; pt verbalizes understanding of appt date/time, parking situation and where to check in, pre-test NPO status and medications ordered, and verified current allergies; name and call back number provided for further questions should they arise Rockwell Alexandria RN Navigator Cardiac Imaging [facility] [phone] office [phone] cell

## 2024-05-24 ENCOUNTER — Other Ambulatory Visit: Payer: Self-pay | Admitting: Bariatrics

## 2024-05-24 DIAGNOSIS — K2100 Gastro-esophageal reflux disease with esophagitis, without bleeding: Secondary | ICD-10-CM

## 2024-05-25 ENCOUNTER — Ambulatory Visit (HOSPITAL_COMMUNITY)
Admission: RE | Admit: 2024-05-25 | Discharge: 2024-05-25 | Disposition: A | Discharge status: Home / Self Care | Source: Ambulatory Visit | Attending: Cardiovascular Disease | Admitting: Cardiovascular Disease

## 2024-05-25 DIAGNOSIS — R072 Precordial pain: Secondary | ICD-10-CM | POA: Diagnosis present

## 2024-05-25 MED ORDER — NITROGLYCERIN 0.4 MG SL SUBL
SUBLINGUAL_TABLET | SUBLINGUAL | Status: AC
  Filled 2024-05-25: qty 2

## 2024-05-25 MED ORDER — IOHEXOL 350 MG/ML SOLN
100.0000 mL | Freq: Once | INTRAVENOUS | Status: AC | PRN
  Administered 2024-05-25: 100 mL via INTRAVENOUS

## 2024-05-25 MED ORDER — NITROGLYCERIN 0.4 MG SL SUBL
0.8000 mg | SUBLINGUAL_TABLET | Freq: Once | SUBLINGUAL | Status: AC
  Administered 2024-05-25: 0.8 mg via SUBLINGUAL

## 2024-06-15 ENCOUNTER — Encounter: Payer: Self-pay | Admitting: Bariatrics

## 2024-06-15 ENCOUNTER — Ambulatory Visit: Admitting: Bariatrics

## 2024-06-15 VITALS — BP 109/74 | HR 86 | Temp 97.8°F | Ht 68.0 in | Wt 239.0 lb

## 2024-06-15 DIAGNOSIS — E669 Obesity, unspecified: Secondary | ICD-10-CM

## 2024-06-15 DIAGNOSIS — F5089 Other specified eating disorder: Secondary | ICD-10-CM

## 2024-06-15 DIAGNOSIS — K2100 Gastro-esophageal reflux disease with esophagitis, without bleeding: Secondary | ICD-10-CM

## 2024-06-15 DIAGNOSIS — E66812 Obesity, class 2: Secondary | ICD-10-CM

## 2024-06-15 DIAGNOSIS — Z6836 Body mass index (BMI) 36.0-36.9, adult: Secondary | ICD-10-CM

## 2024-06-15 DIAGNOSIS — E559 Vitamin D deficiency, unspecified: Secondary | ICD-10-CM

## 2024-06-15 MED ORDER — VITAMIN D (ERGOCALCIFEROL) 1.25 MG (50000 UNIT) PO CAPS: 50000.0000 [IU] | ORAL_CAPSULE | ORAL | 0 refills | Status: DC

## 2024-06-15 MED ORDER — BUPROPION HCL ER (SR) 150 MG PO TB12: 150.0000 mg | ORAL_TABLET | Freq: Every day | ORAL | 0 refills | Status: DC

## 2024-06-15 NOTE — Progress Notes (Signed)
 WEIGHT SUMMARY AND BIOMETRICS  Weight Lost Since Last Visit: 9lb  Weight Gained Since Last Visit: 0   Vitals Temp: 97.8 F (36.6 C) BP: 109/74 Pulse Rate: 86 SpO2: 99 %   Anthropometric Measurements Height: 5' 8 (1.727 m) Weight: 239 lb (108.4 kg) BMI (Calculated): 36.35 Weight at Last Visit: 248lb Weight Lost Since Last Visit: 9lb Weight Gained Since Last Visit: 0 Starting Weight: 261lb Total Weight Loss (lbs): 22 lb (9.979 kg)   Body Composition  Body Fat %: 36.5 % Fat Mass (lbs): 87.6 lbs Muscle Mass (lbs): 144.6 lbs Total Body Water (lbs): 108.4 lbs Visceral Fat Rating : 22   Other Clinical Data Fasting: no Labs: no Today's Visit #: 9 Starting Date: 08/19/23    OBESITY Demarion is here to discuss his progress with his obesity treatment plan along with follow-up of his obesity related diagnoses.    Nutrition Plan: the Category 3 plan - 80% adherence.  Current exercise: none  Interim History:  He is down 9 lbs since his last visit. He is taking Ozempic which causes his stomach to hurt if he eats too much.  Eating all of the food on the plan., Protein intake is as prescribed, Is not skipping meals, and Water intake is adequate.   Pharmacotherapy: Jameire is on Ozempic 1 mg SQ weekly Adverse side effects: fullness with increased food.  Hunger is moderately controlled.  Cravings are moderately controlled.  Assessment/Plan:   Eating disorder/emotional eating Barron has had issues with stress eating, emotional eating, and boredom eating.He has not needed to take any medications for anxiety.  Currently this is moderately controlled. Overall mood is stable. Denies suicidal/homicidal ideation. Medication(s): Wellbutrin  150 mg daily in the am  Plan:  Motivational interviewing as well as evidence-based interventions for health behavior change  were utilized today including the discussion of self monitoring techniques, problem-solving barriers and SMART goal setting techniques.   Discussed distractions to curb eating behaviors. Discussed activities to do with one's hands in the evening  Be sure to get adequate rest as lack of rest can trigger appetite.  Have plan in place for stressful events.  Consider other rewards besides food.    Vitamin D  Deficiency Vitamin D  is at goal of 50.  Most recent vitamin D  level was 23.8. He is on  prescription ergocalciferol  50,000 IU weekly. Lab Results  Component Value Date   VD25OH 23.8 (L) 08/19/2023    Plan: Refill prescription vitamin D  50,000 IU weekly.  Will increase his intake of vitamin D  rich foods.    Generalized Obesity: Current BMI BMI (Calculated): 36.35   Pharmacotherapy Plan Continue  Ozempic 1 mg SQ weekly  Haadi is currently in the action stage of change. As such, his goal is to continue with weight loss efforts.  He has agreed to the Category 3 plan.  Exercise goals: All adults should  avoid inactivity. Some physical activity is better than none, and adults who participate in any amount of physical activity gain some health benefits.  Behavioral modification strategies: increasing lean protein intake, no meal skipping, decrease eating out, meal planning , increase water intake, planning for success, increasing vegetables, decrease junk food, avoiding temptations, keep healthy foods in the home, weigh protein portions, measure portion sizes, and mindful eating.  Treydon has agreed to follow-up with our clinic in 4 weeks.    Objective:   VITALS: Per patient if applicable, see vitals. GENERAL: Alert and in no acute distress. CARDIOPULMONARY: No increased WOB. Speaking in clear sentences.  PSYCH: Pleasant and cooperative. Speech normal rate and rhythm. Affect is appropriate. Insight and judgement are appropriate. Attention is focused, linear, and appropriate.   NEURO: Oriented as arrived to appointment on time with no prompting.   Attestation Statements:   This was prepared with the assistance of Engineer, civil (consulting).  Occasional wrong-word or sound-a-like substitutions may have occurred due to the inherent limitations of voice recognition   Clayborne Daring, DO

## 2024-07-13 ENCOUNTER — Ambulatory Visit: Admitting: Bariatrics

## 2024-08-12 ENCOUNTER — Other Ambulatory Visit: Payer: Self-pay | Admitting: Bariatrics

## 2024-08-12 DIAGNOSIS — F5089 Other specified eating disorder: Secondary | ICD-10-CM

## 2024-08-29 ENCOUNTER — Other Ambulatory Visit: Payer: Self-pay | Admitting: Bariatrics

## 2024-08-29 DIAGNOSIS — E559 Vitamin D deficiency, unspecified: Secondary | ICD-10-CM

## 2024-09-21 ENCOUNTER — Other Ambulatory Visit: Payer: Self-pay | Admitting: Family

## 2024-09-21 DIAGNOSIS — K2100 Gastro-esophageal reflux disease with esophagitis, without bleeding: Secondary | ICD-10-CM

## 2024-09-25 ENCOUNTER — Other Ambulatory Visit: Payer: Self-pay | Admitting: Medical

## 2024-09-25 ENCOUNTER — Other Ambulatory Visit: Payer: Self-pay | Admitting: Bariatrics

## 2024-09-25 DIAGNOSIS — E559 Vitamin D deficiency, unspecified: Secondary | ICD-10-CM

## 2024-10-10 ENCOUNTER — Other Ambulatory Visit: Payer: Self-pay | Admitting: Bariatrics

## 2024-10-10 DIAGNOSIS — E559 Vitamin D deficiency, unspecified: Secondary | ICD-10-CM

## 2024-11-01 ENCOUNTER — Other Ambulatory Visit: Payer: Self-pay | Admitting: Bariatrics

## 2024-11-01 DIAGNOSIS — E559 Vitamin D deficiency, unspecified: Secondary | ICD-10-CM

## 2024-11-13 NOTE — Progress Notes (Unsigned)
 Cardiology Office Note:   Date:  11/16/2024  ID:  Jerry Griffith, DOB [birth year], MRN [id number] PCP:  Jerry Griffith  [facility] Providers Cardiologist:  Jerry Haws, MD Referring MD: Jerry Griffith  Chief Complaint/Reason for Referral: Follow-up coronary artery disease ASSESSMENT:    1. Mild CAD   2. Type 2 diabetes mellitus without complication, without long-term current use of insulin  (HCC)   3. Hypertension associated with diabetes (HCC)   4. Hyperlipidemia associated with type 2 diabetes mellitus (HCC)   5. CKD stage 2 due to type 2 diabetes mellitus (HCC)   6. BMI 40.0-44.9, adult (HCC)   7. Shortness of breath     PLAN:   In order of problems listed above: Mild CAD: Continue aspirin  81 mg, atorvastatin  with dose adjustment as below  T2DM: Continue aspirin  81 mg, atorvastatin  with dose adjustment as below, losartan  50 mg, Jardiance  10 mg Hypertension: Continue losartan  50 mg.  BP is well controlled today. Hyperlipidemia: LDL recently was 81.  This is above goal of 70 in a diabetic.  Will increase atorvastatin  to 80 mg and check lipid panel, LFTs, LP(a) in 2 months CKD stage II: Continue losartan  50 mg, Jardiance  10 mg for renal protection Elevated BMI: Continue Ozempic 2 mg q. weekly. Dyspnea: I suspect this is from body habitus and incomplete compliance with CPAP.  I stressed the need for compliance with CPAP.  I also recommended moderate exercise 3 to 4 days a week.            Dispo:  Return in about 6 months (around 05/16/2025).       I spent 35 minutes reviewing all clinical data during and prior to this visit including all relevant imaging studies, laboratories, clinical information from other health systems and prior notes from both Cardiology and other specialties, interviewing the patient, conducting a complete physical examination, and coordinating care in order to formulate a comprehensive and personalized evaluation and treatment  plan.   History of Present Illness:    FOCUSED PROBLEM LIST:   CAD Minimal pLAD, pRCA, D1 coronary CTA 2025 No significant valve issues, EF 65 to 70% TTE May 2025 T2DM Not on insulin  Hypertension Hyperlipidemia CKD stage II GFR 82 OSA Not compliant CPAP BMI 41   April 2025:  Patient consents to use of AI scribe. The patient is a 63 year old male with above listed medical problems here for emergency room hospital follow-up.  The patient presented to [facility] on March 1 with chest pressure without radiation.  The chest symptoms were getting better when he arrived in the emergency department.  Biomarkers were negative x 2.  EKGs were reassuring.  He was ultimately discharged home returns for further cardiac evaluation.   He has been experiencing chest discomfort since early March, described as a pressure that worsens over time. The discomfort prompted a visit to the emergency room at [facility]. The pain is compressing in nature, occasionally accompanied by tingling in the right arm. He associates the pain with gas and finds some relief with water or 7up, though not immediately. The pain occurs occasionally, sometimes at night, and is not consistently related to exertion or meals, with a frequency of less than weekly.   He experiences shortness of breath, particularly during physical activity such as working. He feels winded and needs to take deep breaths. No episodes of syncope or significant leg swelling are reported, although his legs used to swell more frequently when his diabetes was less  controlled.   He has a history of diabetes, currently managed with Ozempic, which has resulted in a weight loss of approximately 12 pounds.  Plan: Obtain coronary CTA, echocardiogram, start Jardiance  10 mg, increase atorvastatin  to 40 mg and check lipid panel, LFTs, LP(a) in 2 months.  November 2025:  Patient consents to use of AI scribe. Patient's coronary CTA was reassuring.  A  LP(a) was not drawn however lipid panel at external institution showed an LDL of 81 earlier this month.  He experiences shortness of breath and dizziness, particularly when transitioning from sitting to standing or bending over. These symptoms have been present lately and are associated with his blood pressure medications. He has experienced a couple of episodes of significant lightheadedness in the past few months, one of which almost caused him to lose balance. No recent severe lightheadedness spells.  He has a history of mild blockages in the heart arteries, as revealed by a previous CT scan. His recent blood test showed an LDL level of 81, which is above the target goal. He is currently on atorvastatin  40 mg.  He uses a CPAP machine for sleep apnea, although not consistently. He notes that using the CPAP sometimes causes back stiffness, but it helps prevent shortness of breath and fatigue due to non-restorative sleep.  He has a long-standing history of back issues since age 70 and takes Celebrex for pain management. Despite this, he continues to experience back pain.  He is currently on Ozempic and reports gradual weight loss, which is a positive outcome of the medication.       Current Medications: Current Meds  Medication Sig   aspirin  EC 81 MG tablet Take 81 mg by mouth daily. Swallow whole.   atorvastatin  (LIPITOR) 80 MG tablet Take 1 tablet (80 mg total) by mouth daily.   blood glucose meter kit and supplies KIT Dispense based on patient and insurance preference. Use up to four times daily as directed.   Blood Pressure Monitoring (OMRON 3 SERIES BP MONITOR) DEVI 1 each by Does not apply route daily.   buPROPion  (WELLBUTRIN  SR) 150 MG 12 hr tablet Take 1 tablet (150 mg total) by mouth daily.   chlorthalidone  (HYGROTON ) 25 MG tablet Take 1 tablet (25 mg total) by mouth daily.   cholecalciferol (VITAMIN D3) 25 MCG (1000 UNIT) tablet Take 1,000 Units by mouth daily.   clonazePAM   (KLONOPIN ) 0.5 MG tablet Take 1 tablet (0.5 mg total) by mouth 2 (two) times daily as needed for anxiety.   empagliflozin  (JARDIANCE ) 25 MG TABS tablet Take 25 mg by mouth daily.   ferrous sulfate 325 (65 FE) MG tablet Take by mouth daily at 12 noon.   fluticasone (FLONASE) 50 MCG/ACT nasal spray Place 1 spray into both nostrils daily as needed for allergies.   hydrochlorothiazide  (HYDRODIURIL ) 25 MG tablet Take by mouth daily.   Insulin  Pen Needle (PEN NEEDLES 3/16) 31G X 5 MM MISC 1 each by Does not apply route 4 (four) times daily.   losartan  (COZAAR ) 50 MG tablet TAKE ONE TABLET BY MOUTH ONE TIME DAILY   Multiple Vitamin (MULTIVITAMIN) capsule Take 1 capsule by mouth daily.   mupirocin  ointment (BACTROBAN ) 2 % Apply 1 Application topically 2 (two) times daily.   Omega-3 1000 MG CAPS Take 1 g by mouth daily.   Omeprazole  (KLS OMPERAZOLE) 20 MG TBEC Take 1 tablet (20 mg total) by mouth 2 (two) times daily.   OZEMPIC, 1 MG/DOSE, 4 MG/3ML SOPN Inject 1 mg into  the skin.   vitamin B-12 (CYANOCOBALAMIN) 100 MCG tablet Take 100 mcg by mouth daily.   vitamin C (ASCORBIC ACID) 250 MG tablet Take 250 mg by mouth daily.   Vitamin D , Ergocalciferol , (DRISDOL ) 1.25 MG (50000 UNIT) CAPS capsule Take 1 capsule (50,000 Units total) by mouth every 7 (seven) days.   [DISCONTINUED] atorvastatin  (LIPITOR) 40 MG tablet Take 1 tablet (40 mg total) by mouth daily.     Review of Systems:   Please see the history of present illness.    All other systems reviewed and are negative.     EKGs/Labs/Other Test Reviewed:   EKG: 2025 sinus rhythm with low voltages  EKG Interpretation Date/Time:    Ventricular Rate:    PR Interval:    QRS Duration:    QT Interval:    QTC Calculation:   R Axis:      Text Interpretation:          CARDIAC STUDIES: Refer to CV Procedures and Imaging Tabs   Risk Assessment/Calculations:          Physical Exam:   VS:  BP 106/68   Pulse 61   Ht 5' 9 (1.753 m)   Wt  241 lb 6.4 oz (109.5 kg)   SpO2 96%   BMI 35.65 kg/m        Wt Readings from Last 3 Encounters:  11/16/24 241 lb 6.4 oz (109.5 kg)  06/15/24 239 lb (108.4 kg)  04/27/24 248 lb (112.5 kg)      GENERAL:  No apparent distress, AOx3 HEENT:  No carotid bruits, +2 carotid impulses, no scleral icterus CAR: RRR no murmurs, gallops, rubs, or thrills RES:  Clear to auscultation bilaterally ABD:  Soft, nontender, nondistended, positive bowel sounds x 4 VASC:  +2 radial pulses, +2 carotid pulses NEURO:  CN 2-12 grossly intact; motor and sensory grossly intact PSYCH:  No active depression or anxiety EXT:  No edema, ecchymosis, or cyanosis  Signed, Daijha Leggio K Danely Bayliss, MD  11/16/2024 4:07 PM    [facility] [address] [place], [place], [place]  [address] Phone: [phone]; Fax: [phone]   Note:  This document was prepared using Dragon voice recognition software and may include unintentional dictation errors.

## 2024-11-16 ENCOUNTER — Encounter: Payer: Self-pay | Admitting: Internal Medicine

## 2024-11-16 ENCOUNTER — Other Ambulatory Visit (HOSPITAL_COMMUNITY): Payer: Self-pay

## 2024-11-16 ENCOUNTER — Ambulatory Visit: Attending: Internal Medicine | Admitting: Internal Medicine

## 2024-11-16 VITALS — BP 106/68 | HR 61 | Ht 69.0 in | Wt 241.4 lb

## 2024-11-16 DIAGNOSIS — E1159 Type 2 diabetes mellitus with other circulatory complications: Secondary | ICD-10-CM | POA: Diagnosis not present

## 2024-11-16 DIAGNOSIS — Z6841 Body Mass Index (BMI) 40.0 and over, adult: Secondary | ICD-10-CM

## 2024-11-16 DIAGNOSIS — I2510 Atherosclerotic heart disease of native coronary artery without angina pectoris: Secondary | ICD-10-CM | POA: Diagnosis not present

## 2024-11-16 DIAGNOSIS — E1169 Type 2 diabetes mellitus with other specified complication: Secondary | ICD-10-CM

## 2024-11-16 DIAGNOSIS — R0602 Shortness of breath: Secondary | ICD-10-CM

## 2024-11-16 DIAGNOSIS — N182 Chronic kidney disease, stage 2 (mild): Secondary | ICD-10-CM

## 2024-11-16 DIAGNOSIS — E119 Type 2 diabetes mellitus without complications: Secondary | ICD-10-CM

## 2024-11-16 DIAGNOSIS — I152 Hypertension secondary to endocrine disorders: Secondary | ICD-10-CM

## 2024-11-16 DIAGNOSIS — E785 Hyperlipidemia, unspecified: Secondary | ICD-10-CM

## 2024-11-16 DIAGNOSIS — E1122 Type 2 diabetes mellitus with diabetic chronic kidney disease: Secondary | ICD-10-CM

## 2024-11-16 MED ORDER — ATORVASTATIN CALCIUM 80 MG PO TABS: 80.0000 mg | ORAL_TABLET | Freq: Every day | ORAL | 3 refills | Status: AC

## 2024-11-16 MED ORDER — OMRON 3 SERIES BP MONITOR DEVI
1.0000 | Freq: Every day | 0 refills | Status: AC
  Filled 2024-11-16: qty 1, 30d supply, fill #0

## 2024-11-16 NOTE — Patient Instructions (Signed)
 Medication Instructions:  Increase atorvastatin  to 80 mg once daily *If you need a refill on your cardiac medications before your next appointment, please call your pharmacy*  Lab Work: Fasting lipid panel, LFTs and Lpa in 2 months If you have labs (blood work) drawn today and your tests are completely normal, you will receive your results only by: MyChart Message (if you have MyChart) OR A paper copy in the mail If you have any lab test that is abnormal or we need to change your treatment, we will call you to review the results.  Testing/Procedures: NONE  Follow-Up: At [facility], you and your health needs are our priority.  As part of our continuing mission to provide you with exceptional heart care, our providers are all part of one team.  This team includes your primary Cardiologist (physician) and Advanced Practice Providers or APPs (Physician Assistants and Nurse Practitioners) who all work together to provide you with the care you need, when you need it.  Your next appointment:   6 month(s)  Provider:   APP  We recommend signing up for the patient portal called MyChart.  Sign up information is provided on this After Visit Summary.  MyChart is used to connect with patients for Virtual Visits (Telemedicine).  Patients are able to view lab/test results, encounter notes, upcoming appointments, etc.  Non-urgent messages can be sent to your provider as well.   To learn more about what you can do with MyChart, go to [email or web address].   Other Instructions Omron blood pressure cuff

## 2024-11-20 ENCOUNTER — Other Ambulatory Visit: Payer: Self-pay | Admitting: Medical

## 2024-11-27 ENCOUNTER — Other Ambulatory Visit: Payer: Self-pay | Admitting: Medical

## 2024-12-14 ENCOUNTER — Encounter: Payer: Self-pay | Admitting: Bariatrics

## 2024-12-14 ENCOUNTER — Ambulatory Visit: Admitting: Bariatrics

## 2024-12-14 VITALS — BP 142/85 | HR 71 | Ht 68.0 in | Wt 244.0 lb

## 2024-12-14 DIAGNOSIS — K2100 Gastro-esophageal reflux disease with esophagitis, without bleeding: Secondary | ICD-10-CM | POA: Diagnosis not present

## 2024-12-14 DIAGNOSIS — E559 Vitamin D deficiency, unspecified: Secondary | ICD-10-CM | POA: Diagnosis not present

## 2024-12-14 DIAGNOSIS — Z6837 Body mass index (BMI) 37.0-37.9, adult: Secondary | ICD-10-CM

## 2024-12-14 DIAGNOSIS — E669 Obesity, unspecified: Secondary | ICD-10-CM

## 2024-12-14 DIAGNOSIS — I152 Hypertension secondary to endocrine disorders: Secondary | ICD-10-CM

## 2024-12-14 DIAGNOSIS — I10 Essential (primary) hypertension: Secondary | ICD-10-CM

## 2024-12-14 DIAGNOSIS — F5089 Other specified eating disorder: Secondary | ICD-10-CM

## 2024-12-14 DIAGNOSIS — E66812 Obesity, class 2: Secondary | ICD-10-CM

## 2024-12-14 MED ORDER — OMEPRAZOLE 20 MG PO TBEC: 1.0000 | DELAYED_RELEASE_TABLET | Freq: Two times a day (BID) | ORAL | 0 refills | Status: AC

## 2024-12-14 MED ORDER — BUPROPION HCL ER (SR) 150 MG PO TB12: 150.0000 mg | ORAL_TABLET | Freq: Every day | ORAL | 0 refills | Status: DC

## 2024-12-14 MED ORDER — CHLORTHALIDONE 25 MG PO TABS: 25.0000 mg | ORAL_TABLET | Freq: Every day | ORAL | 0 refills | Status: AC

## 2024-12-14 MED ORDER — VITAMIN D (ERGOCALCIFEROL) 1.25 MG (50000 UNIT) PO CAPS: 50000.0000 [IU] | ORAL_CAPSULE | ORAL | 0 refills | Status: DC

## 2024-12-14 NOTE — Progress Notes (Signed)
 "                                                                                                             WEIGHT SUMMARY AND BIOMETRICS  Weight Lost Since Last Visit: 0  Weight Gained Since Last Visit: 5lb   Vitals BP: (!) 142/85 Pulse Rate: 71 SpO2: 99 %   Anthropometric Measurements Height: 5' 8 (1.727 m) Weight: 244 lb (110.7 kg) BMI (Calculated): 37.11 Weight at Last Visit: 239lb Weight Lost Since Last Visit: 0 Weight Gained Since Last Visit: 5lb Starting Weight: 261lb Total Weight Loss (lbs): 17 lb (7.711 kg)   Body Composition  Body Fat %: 37.8 % Fat Mass (lbs): 92.4 lbs Muscle Mass (lbs): 144.4 lbs Total Body Water (lbs): 112.8 lbs Visceral Fat Rating : 23   Other Clinical Data Fasting: no Labs: no Today's Visit #: 10 Starting Date: 08/19/23    OBESITY Jerry Griffith is here to discuss his progress with his obesity treatment plan along with follow-up of his obesity related diagnoses.    Nutrition Plan: the Category 3 plan - 70% adherence.  Current exercise: none  Interim History:  He was last seen on 06/15/24. He has gained 5 lbs since his last visit.  Eating all of the food on the plan., Protein intake is as prescribed, Is not skipping meals, Not journaling consistently., and Water intake is adequate. He states that he has a history of kidney stones and that he has having some mild burning with urination but no pain. He was previously able to pass the urinary calculi.   Pharmacotherapy: Jerry Griffith is on Ozempic 1 mg SQ weekly Adverse side effects: None Hunger is moderately controlled.  Cravings are moderately controlled.  Assessment/Plan:   Vitamin D  Deficiency Vitamin D  is not at goal of 50.  Most recent vitamin D  level was 23.8. He is on  prescription ergocalciferol  50,000 IU weekly. Lab Results  Component Value Date   VD25OH 23.8 (L) 08/19/2023    Plan: Refill prescription vitamin D  50,000 IU weekly.   Eating disorder/emotional  eating Jerry Griffith has had issues with stress eating, emotional eating, and boredom eating. Currently this is moderately controlled. Overall mood is stable. Denies suicidal/homicidal ideation. Medication(s): Wellbutrin  150 mg daily in the am  Plan: Discussed distractions to curb eating behaviors. Discussed activities to do with one's hands in the evening  Be sure to get adequate rest as lack of rest can trigger appetite.  Have plan in place for stressful events.  Consider other rewards besides food.   Increase water and decrease soft drinks.  Will make better desserts.   GERD:   He has a history of GERD which has been relatively well-controlled but he has been out of his medication.  Plan: Rx: Omeprazole  20 mg 1 twice daily with a 59-month supply and no refills. Will follow dietary recommendations.  Hypertension Hypertension control uncertain.  Medication(s): Chlorthalidone  25 mg, Hydrochlorothiazide  25 mg daily , and Losartan  (Cozaar ) 50 mg once daily  BP Readings from Last 3 Encounters:  12/14/24 ROLLEN)  142/85  11/16/24 106/68  06/15/24 109/74   Lab Results  Component Value Date   CREATININE 0.94 02/22/2024   CREATININE 0.86 08/19/2023   CREATININE 0.84 04/11/2023   Lab Results  Component Value Date   GFR 93.12 04/11/2023   GFR 97.84 11/12/2022   GFR 98.37 09/10/2022    Plan: Continue all antihypertensives at current dosages. No added salt. Will keep sodium content to 1,500 mg or less per day.   Chlorthalidone  25 mg 1 p.o. daily #90 with no refills.   Will call his PCP today and discuss his burning urination and will try to get into see someone for at least a urinalysis and perhaps a culture he may also need a test and has had a urologist in the past to which his PCP could refer him.  I stressed to him that he must call today and take care of this.   Generalized Obesity: Current BMI BMI (Calculated): 37.11   Pharmacotherapy Plan Continue  Ozempic 1 mg SQ  weekly  Jerry Griffith is not currently in the action stage of change. As such, his goal is to continue with weight loss efforts.  He has agreed to the Category 3 plan.  Exercise goals: All adults should avoid inactivity. Some physical activity is better than none, and adults who participate in any amount of physical activity gain some health benefits. He will resume his previous exercise regimen.  Behavioral modification strategies: increasing lean protein intake, decreasing simple carbohydrates , no meal skipping, decrease eating out, meal planning , increase water intake, measure portion sizes, work on smaller portions, and mindful eating.  Jerry Griffith has agreed to follow-up with our clinic in 4 weeks will check his metabolism (IC and fasting labs).   Labs reviewed on 11/02/24 today (CMP, Lipids, HgbA1c, and thyroid panel).       Objective:   VITALS: Per patient if applicable, see vitals. GENERAL: Alert and in no acute distress. CARDIOPULMONARY: No increased WOB. Speaking in clear sentences.  PSYCH: Pleasant and cooperative. Speech normal rate and rhythm. Affect is appropriate. Insight and judgement are appropriate. Attention is focused, linear, and appropriate.  NEURO: Oriented as arrived to appointment on time with no prompting.   Attestation Statements:   This was prepared with the assistance of Engineer, Civil (consulting).  Occasional wrong-word or sound-a-like substitutions may have occurred due to the inherent limitations of voice recognition.  Clayborne Daring, DO   "

## 2025-01-12 ENCOUNTER — Other Ambulatory Visit: Payer: Self-pay | Admitting: Bariatrics

## 2025-01-12 DIAGNOSIS — F5089 Other specified eating disorder: Secondary | ICD-10-CM

## 2025-01-12 LAB — LIPID PANEL
Chol/HDL Ratio: 3.2 ratio (ref 0.0–5.0)
Cholesterol, Total: 146 mg/dL (ref 100–199)
HDL: 45 mg/dL
LDL Chol Calc (NIH): 80 mg/dL (ref 0–99)
Triglycerides: 119 mg/dL (ref 0–149)
VLDL Cholesterol Cal: 21 mg/dL (ref 5–40)

## 2025-01-12 LAB — HEPATIC FUNCTION PANEL
ALT: 31 IU/L (ref 0–44)
AST: 21 IU/L (ref 0–40)
Albumin: 4.6 g/dL (ref 3.9–4.9)
Alkaline Phosphatase: 130 IU/L — ABNORMAL HIGH (ref 47–123)
Bilirubin Total: 0.7 mg/dL (ref 0.0–1.2)
Bilirubin, Direct: 0.23 mg/dL (ref 0.00–0.40)
Total Protein: 6.8 g/dL (ref 6.0–8.5)

## 2025-01-12 LAB — LIPOPROTEIN A (LPA): Lipoprotein (a): 29.5 nmol/L

## 2025-01-13 ENCOUNTER — Ambulatory Visit: Payer: Self-pay | Admitting: Internal Medicine

## 2025-01-13 DIAGNOSIS — E1169 Type 2 diabetes mellitus with other specified complication: Secondary | ICD-10-CM

## 2025-01-13 MED ORDER — EZETIMIBE 10 MG PO TABS: 10.0000 mg | ORAL_TABLET | Freq: Every day | ORAL | 3 refills | Status: AC

## 2025-01-18 ENCOUNTER — Ambulatory Visit: Admitting: Bariatrics

## 2025-01-19 ENCOUNTER — Other Ambulatory Visit: Payer: Self-pay | Admitting: Bariatrics

## 2025-01-19 DIAGNOSIS — E559 Vitamin D deficiency, unspecified: Secondary | ICD-10-CM

## 2025-02-01 ENCOUNTER — Ambulatory Visit: Admitting: Bariatrics
